# Patient Record
Sex: Female | Born: 1955 | Race: White | Hispanic: No | Marital: Single | Smoking: Current some day smoker
Health system: Southern US, Community
[De-identification: ages and names within clinical notes are randomized; demographics above are authoritative.]

## PROBLEM LIST (undated history)

## (undated) DIAGNOSIS — K219 Gastro-esophageal reflux disease without esophagitis: Secondary | ICD-10-CM

## (undated) DIAGNOSIS — J189 Pneumonia, unspecified organism: Secondary | ICD-10-CM

## (undated) DIAGNOSIS — K8050 Calculus of bile duct without cholangitis or cholecystitis without obstruction: Secondary | ICD-10-CM

## (undated) DIAGNOSIS — I739 Peripheral vascular disease, unspecified: Secondary | ICD-10-CM

## (undated) DIAGNOSIS — I10 Essential (primary) hypertension: Secondary | ICD-10-CM

## (undated) HISTORY — PX: OTHER SURGICAL HISTORY: SHX169

## (undated) HISTORY — PX: DILATION AND CURETTAGE OF UTERUS: SHX78

## (undated) HISTORY — PX: TONSILLECTOMY: SUR1361

---

## 2013-06-21 ENCOUNTER — Other Ambulatory Visit: Payer: Self-pay | Admitting: Family Medicine

## 2013-06-21 DIAGNOSIS — R1013 Epigastric pain: Secondary | ICD-10-CM

## 2013-06-28 ENCOUNTER — Other Ambulatory Visit: Payer: Self-pay

## 2013-11-03 ENCOUNTER — Ambulatory Visit
Admission: RE | Admit: 2013-11-03 | Discharge: 2013-11-03 | Disposition: A | Discharge status: Home / Self Care | Payer: Commercial Managed Care - PPO | Source: Ambulatory Visit | Attending: Family Medicine | Admitting: Family Medicine

## 2013-11-03 ENCOUNTER — Other Ambulatory Visit: Payer: Self-pay | Admitting: Family Medicine

## 2013-11-03 DIAGNOSIS — M7989 Other specified soft tissue disorders: Secondary | ICD-10-CM

## 2014-03-29 ENCOUNTER — Other Ambulatory Visit: Payer: Self-pay | Admitting: Gastroenterology

## 2014-03-29 DIAGNOSIS — R7402 Elevation of levels of lactic acid dehydrogenase (LDH): Secondary | ICD-10-CM

## 2014-03-29 DIAGNOSIS — R740 Nonspecific elevation of levels of transaminase and lactic acid dehydrogenase [LDH]: Principal | ICD-10-CM

## 2014-04-04 ENCOUNTER — Ambulatory Visit
Admission: RE | Admit: 2014-04-04 | Discharge: 2014-04-04 | Disposition: A | Discharge status: Home / Self Care | Payer: Commercial Managed Care - PPO | Source: Ambulatory Visit | Attending: Gastroenterology | Admitting: Gastroenterology

## 2014-04-04 DIAGNOSIS — R740 Nonspecific elevation of levels of transaminase and lactic acid dehydrogenase [LDH]: Principal | ICD-10-CM

## 2014-04-04 DIAGNOSIS — R7402 Elevation of levels of lactic acid dehydrogenase (LDH): Secondary | ICD-10-CM

## 2014-05-24 ENCOUNTER — Other Ambulatory Visit (HOSPITAL_COMMUNITY)
Admission: RE | Admit: 2014-05-24 | Discharge: 2014-05-24 | Disposition: A | Discharge status: Home / Self Care | Payer: Commercial Managed Care - PPO | Source: Ambulatory Visit | Attending: Family Medicine | Admitting: Family Medicine

## 2014-05-24 ENCOUNTER — Other Ambulatory Visit: Payer: Self-pay | Admitting: Family Medicine

## 2014-05-24 DIAGNOSIS — Z01419 Encounter for gynecological examination (general) (routine) without abnormal findings: Secondary | ICD-10-CM | POA: Diagnosis not present

## 2014-05-25 LAB — CYTOLOGY - PAP

## 2014-07-27 ENCOUNTER — Other Ambulatory Visit: Payer: Self-pay | Admitting: Family Medicine

## 2014-07-27 DIAGNOSIS — R1013 Epigastric pain: Secondary | ICD-10-CM

## 2014-07-28 ENCOUNTER — Ambulatory Visit
Admission: RE | Admit: 2014-07-28 | Discharge: 2014-07-28 | Disposition: A | Discharge status: Home / Self Care | Payer: Commercial Managed Care - PPO | Source: Ambulatory Visit | Attending: Family Medicine | Admitting: Family Medicine

## 2014-07-28 DIAGNOSIS — R1013 Epigastric pain: Secondary | ICD-10-CM

## 2014-07-28 MED ORDER — IOHEXOL 300 MG/ML  SOLN
100.0000 mL | Freq: Once | INTRAMUSCULAR | Status: AC | PRN
  Administered 2014-07-28: 100 mL via INTRAVENOUS

## 2014-08-03 ENCOUNTER — Other Ambulatory Visit (INDEPENDENT_AMBULATORY_CARE_PROVIDER_SITE_OTHER): Payer: Self-pay | Admitting: General Surgery

## 2014-08-03 DIAGNOSIS — Z9884 Bariatric surgery status: Secondary | ICD-10-CM

## 2016-01-03 ENCOUNTER — Other Ambulatory Visit: Payer: Self-pay | Admitting: Surgery

## 2016-01-03 DIAGNOSIS — R748 Abnormal levels of other serum enzymes: Secondary | ICD-10-CM

## 2016-01-10 ENCOUNTER — Ambulatory Visit
Admission: RE | Admit: 2016-01-10 | Discharge: 2016-01-10 | Disposition: A | Discharge status: Home / Self Care | Payer: Managed Care, Other (non HMO) | Source: Ambulatory Visit | Attending: Surgery | Admitting: Surgery

## 2016-01-10 DIAGNOSIS — R748 Abnormal levels of other serum enzymes: Secondary | ICD-10-CM

## 2016-01-10 MED ORDER — IOPAMIDOL (ISOVUE-300) INJECTION 61%
100.0000 mL | Freq: Once | INTRAVENOUS | Status: AC | PRN
  Administered 2016-01-10: 100 mL via INTRAVENOUS

## 2016-01-27 ENCOUNTER — Ambulatory Visit: Payer: Self-pay | Admitting: Surgery

## 2016-07-08 NOTE — Progress Notes (Signed)
Surgery on 07/15/16.  Need  Orders in EPIC .  Thank You.

## 2016-07-10 ENCOUNTER — Ambulatory Visit: Payer: Self-pay | Admitting: Surgery

## 2016-07-11 ENCOUNTER — Encounter (HOSPITAL_COMMUNITY)
Admission: RE | Admit: 2016-07-11 | Discharge: 2016-07-11 | Disposition: A | Discharge status: Home / Self Care | Payer: Managed Care, Other (non HMO) | Source: Ambulatory Visit | Attending: Surgery | Admitting: Surgery

## 2016-07-11 ENCOUNTER — Encounter (HOSPITAL_COMMUNITY): Payer: Self-pay

## 2016-07-11 DIAGNOSIS — Z01818 Encounter for other preprocedural examination: Secondary | ICD-10-CM | POA: Diagnosis present

## 2016-07-11 DIAGNOSIS — I10 Essential (primary) hypertension: Secondary | ICD-10-CM | POA: Insufficient documentation

## 2016-07-11 HISTORY — DX: Essential (primary) hypertension: I10

## 2016-07-11 HISTORY — DX: Gastro-esophageal reflux disease without esophagitis: K21.9

## 2016-07-11 HISTORY — DX: Pneumonia, unspecified organism: J18.9

## 2016-07-11 HISTORY — DX: Peripheral vascular disease, unspecified: I73.9

## 2016-07-11 LAB — CBC
HEMATOCRIT: 34.6 % — AB (ref 36.0–46.0)
Hemoglobin: 11.3 g/dL — ABNORMAL LOW (ref 12.0–15.0)
MCH: 30.1 pg (ref 26.0–34.0)
MCHC: 32.7 g/dL (ref 30.0–36.0)
MCV: 92.3 fL (ref 78.0–100.0)
Platelets: 211 10*3/uL (ref 150–400)
RBC: 3.75 MIL/uL — AB (ref 3.87–5.11)
RDW: 13.9 % (ref 11.5–15.5)
WBC: 4.8 10*3/uL (ref 4.0–10.5)

## 2016-07-11 LAB — BASIC METABOLIC PANEL
ANION GAP: 8 (ref 5–15)
BUN: 22 mg/dL — ABNORMAL HIGH (ref 6–20)
CO2: 26 mmol/L (ref 22–32)
Calcium: 8.9 mg/dL (ref 8.9–10.3)
Chloride: 104 mmol/L (ref 101–111)
Creatinine, Ser: 0.92 mg/dL (ref 0.44–1.00)
GLUCOSE: 96 mg/dL (ref 65–99)
POTASSIUM: 3.5 mmol/L (ref 3.5–5.1)
Sodium: 138 mmol/L (ref 135–145)

## 2016-07-11 NOTE — Patient Instructions (Addendum)
Stacy HendersonLori Lane  07/11/2016   Your procedure is scheduled on: 07/15/16  [facility]  [facility]  [facility] to 3rd floor to  Short Stay Center at 9:15 AM.  Call this number if you have problems the morning of surgery [phone]   Remember: ONLY 1 PERSON MAY GO WITH YOU TO SHORT STAY TO GET  READY MORNING OF YOUR SURGERY.  Do not eat food or drink liquids :After Midnight.     Take these medicines the morning of surgery with A SIP OF WATER: Cymbalta                               You may not have any metal on your body including hair pins and              piercings  Do not wear jewelry, make-up, lotions, powders or perfumes, deodorant             Do not wear nail polish.  Do not shave  48 hours prior to surgery.              Men may shave face and neck.   Do not bring valuables to the hospital. [facility] IS NOT             RESPONSIBLE   FOR VALUABLES.  Contacts, dentures or bridgework may not be worn into surgery.  Leave suitcase in the car. After surgery it may be brought to your room.                Please read over the following fact sheets you were given: _____________________________________________________________________             [facility] - Preparing for Surgery Before surgery, you can play an important role.  Because skin is not sterile, your skin needs to be as free of germs as possible.  You can reduce the number of germs on your skin by washing with CHG (chlorahexidine gluconate) soap before surgery.  CHG is an antiseptic cleaner which kills germs and bonds with the skin to continue killing germs even after washing. Please DO NOT use if you have an allergy to CHG or antibacterial soaps.  If your skin becomes reddened/irritated stop using the CHG and inform your nurse when you arrive at Short Stay. Do not shave (including legs and underarms) for at least 48 hours prior to the first CHG shower.  You may shave your  face/neck. Please follow these instructions carefully:  1.  Shower with CHG Soap the night before surgery and the  morning of Surgery.  2.  If you choose to wash your hair, wash your hair first as usual with your  normal  shampoo.  3.  After you shampoo, rinse your hair and body thoroughly to remove the  shampoo.                           4.  Use CHG as you would any other liquid soap.  You can apply chg directly  to the skin and wash                       Gently with a scrungie or clean washcloth.  5.  Apply the CHG Soap to your body ONLY  FROM THE NECK DOWN.   Do not use on face/ open                           Wound or open sores. Avoid contact with eyes, ears mouth and genitals (private parts).                       Wash face,  Genitals (private parts) with your normal soap.             6.  Wash thoroughly, paying special attention to the area where your surgery  will be performed.  7.  Thoroughly rinse your body with warm water from the neck down.  8.  DO NOT shower/wash with your normal soap after using and rinsing off  the CHG Soap.                9.  Pat yourself dry with a clean towel.            10.  Wear clean pajamas.            11.  Place clean sheets on your bed the night of your first shower and do not  sleep with pets. Day of Surgery : Do not apply any lotions/deodorants the morning of surgery.  Please wear clean clothes to the hospital/surgery center.  FAILURE TO FOLLOW THESE INSTRUCTIONS MAY RESULT IN THE CANCELLATION OF YOUR SURGERY PATIENT SIGNATURE_________________________________  NURSE SIGNATURE__________________________________  ________________________________________________________________________

## 2016-07-14 ENCOUNTER — Ambulatory Visit: Payer: Self-pay | Admitting: Surgery

## 2016-07-14 NOTE — H&P (Signed)
Stacy HendersonLori Lane 01/24/2016 10:22 AM Location: [facility] Patient #: [id number] DOB: 06-01-1956 Divorced / Language: [place] / Race: White Female   History of Present Illness Stacy Lane(Stacy Lane B. Daphine DeutscherMartin MD; 01/24/2016 10:48 AM) The patient is a 60 year old female who presents for evaluation of gall stones. I reviewed her recent CT scan with her. She has gallstones and elevated alkaline phosphatase up to 600 on some studies. We have talked about doing a laparoscopic cholecystectomy before but she had decided against that. She says she does feel tired and gets bloated at times and just has been feeling worse the last few months. She [place] open gastric [place] about [place].  I recommended laparoscopic cholecystectomy with intraoperative cholangiogram and with probably a liver biopsy. After much discussion we decided to go and proceed with that. Procedure was explained to her in some detail. We'll proceed.   Allergies Fay Records(Ashley Beck, [place]; 01/24/2016 10:22 AM) Codeine Phosphate *ANALGESICS - OPIOID*  Medication History Fay Records(Ashley Beck, CMA; 01/24/2016 10:22 AM) Vyvanse (60MG  Capsule, Oral) Active. Lisinopril-Hydrochlorothiazide (20-25MG  Tablet, Oral) Active. Medications Reconciled  Vitals Fay Records(Ashley Beck CMA; 01/24/2016 10:23 AM) 01/24/2016 10:23 AM Weight: 160 lb Height: 66in Body Surface Area: 1.82 m Body Mass Index: 25.82 kg/m  Temp.: 97.69F  Pulse: 115 (Regular)  BP: 122/86 (Sitting, Left Arm, Standard)       Physical Exam (Masato Pettie B. Daphine DeutscherMartin MD; 01/24/2016 10:49 AM) The physical exam findings are as follows: Note:HEENT normal Neck supple Chest clear Heart SR without murmurs Abdomen nontender; prior open bypass Ext FROM.Marland Kitchen.wearing support hose for more recent fluid retention.    Assessment & Plan Stacy Lane(Shirley Decamp B. Daphine DeutscherMartin MD; 01/24/2016 10:51 AM) SYMPTOMATIC CHOLELITHIASIS (K80.20) Impression: Will proceed with scheduling lap chole with IOC and liver biopsy.  Procedures explained to her in some detail. This had been done before as we were going to schedule.

## 2016-07-15 ENCOUNTER — Encounter (HOSPITAL_COMMUNITY)
Admission: RE | Disposition: A | Discharge status: Home / Self Care | Payer: Self-pay | Source: Ambulatory Visit | Attending: Surgery

## 2016-07-15 ENCOUNTER — Ambulatory Visit (HOSPITAL_COMMUNITY): Payer: Managed Care, Other (non HMO)

## 2016-07-15 ENCOUNTER — Ambulatory Visit (HOSPITAL_COMMUNITY): Payer: Managed Care, Other (non HMO) | Admitting: Certified Registered"

## 2016-07-15 ENCOUNTER — Ambulatory Visit (HOSPITAL_COMMUNITY)
Admission: RE | Admit: 2016-07-15 | Discharge: 2016-07-15 | Disposition: A | Discharge status: Home / Self Care | Payer: Managed Care, Other (non HMO) | Source: Ambulatory Visit | Attending: Surgery | Admitting: Surgery

## 2016-07-15 ENCOUNTER — Encounter (HOSPITAL_COMMUNITY): Payer: Self-pay | Admitting: *Deleted

## 2016-07-15 DIAGNOSIS — I739 Peripheral vascular disease, unspecified: Secondary | ICD-10-CM | POA: Insufficient documentation

## 2016-07-15 DIAGNOSIS — F17210 Nicotine dependence, cigarettes, uncomplicated: Secondary | ICD-10-CM | POA: Insufficient documentation

## 2016-07-15 DIAGNOSIS — K219 Gastro-esophageal reflux disease without esophagitis: Secondary | ICD-10-CM | POA: Diagnosis not present

## 2016-07-15 DIAGNOSIS — Z9884 Bariatric surgery status: Secondary | ICD-10-CM | POA: Diagnosis not present

## 2016-07-15 DIAGNOSIS — Z419 Encounter for procedure for purposes other than remedying health state, unspecified: Secondary | ICD-10-CM

## 2016-07-15 DIAGNOSIS — Z885 Allergy status to narcotic agent status: Secondary | ICD-10-CM | POA: Diagnosis not present

## 2016-07-15 DIAGNOSIS — K8010 Calculus of gallbladder with chronic cholecystitis without obstruction: Secondary | ICD-10-CM | POA: Insufficient documentation

## 2016-07-15 DIAGNOSIS — I10 Essential (primary) hypertension: Secondary | ICD-10-CM | POA: Diagnosis not present

## 2016-07-15 DIAGNOSIS — K811 Chronic cholecystitis: Secondary | ICD-10-CM | POA: Diagnosis present

## 2016-07-15 HISTORY — PX: CHOLECYSTECTOMY: SHX55

## 2016-07-15 LAB — COMPREHENSIVE METABOLIC PANEL
ALT: 45 U/L (ref 14–54)
ANION GAP: 8 (ref 5–15)
AST: 74 U/L — ABNORMAL HIGH (ref 15–41)
Albumin: 3.2 g/dL — ABNORMAL LOW (ref 3.5–5.0)
Alkaline Phosphatase: 570 U/L — ABNORMAL HIGH (ref 38–126)
BUN: 19 mg/dL (ref 6–20)
CHLORIDE: 105 mmol/L (ref 101–111)
CO2: 26 mmol/L (ref 22–32)
CREATININE: 1 mg/dL (ref 0.44–1.00)
Calcium: 8.7 mg/dL — ABNORMAL LOW (ref 8.9–10.3)
Glucose, Bld: 104 mg/dL — ABNORMAL HIGH (ref 65–99)
POTASSIUM: 4.2 mmol/L (ref 3.5–5.1)
SODIUM: 139 mmol/L (ref 135–145)
Total Bilirubin: 0.9 mg/dL (ref 0.3–1.2)
Total Protein: 6.7 g/dL (ref 6.5–8.1)

## 2016-07-15 SURGERY — LAPAROSCOPIC CHOLECYSTECTOMY WITH INTRAOPERATIVE CHOLANGIOGRAM
Anesthesia: General

## 2016-07-15 MED ORDER — IOPAMIDOL (ISOVUE-300) INJECTION 61%
INTRAVENOUS | Status: DC | PRN
  Administered 2016-07-15: 14 mL

## 2016-07-15 MED ORDER — CEFAZOLIN SODIUM-DEXTROSE 2-4 GM/100ML-% IV SOLN
2.0000 g | INTRAVENOUS | Status: AC
  Administered 2016-07-15: 2 g via INTRAVENOUS
  Filled 2016-07-15: qty 100

## 2016-07-15 MED ORDER — CEFAZOLIN SODIUM-DEXTROSE 2-4 GM/100ML-% IV SOLN: 2.0000 g | INTRAVENOUS | Status: DC

## 2016-07-15 MED ORDER — ACETAMINOPHEN 500 MG PO TABS: 1000.0000 mg | ORAL_TABLET | ORAL | Status: DC

## 2016-07-15 MED ORDER — BUPIVACAINE HCL (PF) 0.5 % IJ SOLN
INTRAMUSCULAR | Status: AC
  Filled 2016-07-15: qty 30

## 2016-07-15 MED ORDER — CEFAZOLIN SODIUM-DEXTROSE 2-4 GM/100ML-% IV SOLN
INTRAVENOUS | Status: AC
  Filled 2016-07-15: qty 100

## 2016-07-15 MED ORDER — HEPARIN SODIUM (PORCINE) 5000 UNIT/ML IJ SOLN: 5000.0000 [IU] | Freq: Once | INTRAMUSCULAR | Status: DC

## 2016-07-15 MED ORDER — FENTANYL CITRATE (PF) 250 MCG/5ML IJ SOLN
INTRAMUSCULAR | Status: AC
  Filled 2016-07-15: qty 5

## 2016-07-15 MED ORDER — FENTANYL CITRATE (PF) 100 MCG/2ML IJ SOLN
INTRAMUSCULAR | Status: AC
  Filled 2016-07-15: qty 2

## 2016-07-15 MED ORDER — FENTANYL CITRATE (PF) 100 MCG/2ML IJ SOLN
INTRAMUSCULAR | Status: DC | PRN
  Administered 2016-07-15 (×2): 50 ug via INTRAVENOUS
  Administered 2016-07-15: 100 ug via INTRAVENOUS
  Administered 2016-07-15: 50 ug via INTRAVENOUS

## 2016-07-15 MED ORDER — LIDOCAINE HCL (CARDIAC) 20 MG/ML IV SOLN
INTRAVENOUS | Status: DC | PRN
  Administered 2016-07-15: 50 mg via INTRAVENOUS

## 2016-07-15 MED ORDER — LIDOCAINE HCL (CARDIAC) 20 MG/ML IV SOLN
INTRAVENOUS | Status: DC | PRN
  Administered 2016-07-15: 50 mg via INTRATRACHEAL

## 2016-07-15 MED ORDER — SUGAMMADEX SODIUM 200 MG/2ML IV SOLN
INTRAVENOUS | Status: DC | PRN
  Administered 2016-07-15: 160 mg via INTRAVENOUS

## 2016-07-15 MED ORDER — FENTANYL CITRATE (PF) 100 MCG/2ML IJ SOLN
25.0000 ug | INTRAMUSCULAR | Status: DC | PRN
  Administered 2016-07-15: 50 ug via INTRAVENOUS

## 2016-07-15 MED ORDER — ACETAMINOPHEN 500 MG PO TABS
1000.0000 mg | ORAL_TABLET | ORAL | Status: AC
  Administered 2016-07-15: 1000 mg via ORAL
  Filled 2016-07-15: qty 2

## 2016-07-15 MED ORDER — MIDAZOLAM HCL 2 MG/2ML IJ SOLN
INTRAMUSCULAR | Status: DC | PRN
  Administered 2016-07-15: 2 mg via INTRAVENOUS

## 2016-07-15 MED ORDER — CELECOXIB 200 MG PO CAPS: 400.0000 mg | ORAL_CAPSULE | ORAL | Status: DC

## 2016-07-15 MED ORDER — PROPOFOL 10 MG/ML IV BOLUS
INTRAVENOUS | Status: DC | PRN
  Administered 2016-07-15: 160 mg via INTRAVENOUS

## 2016-07-15 MED ORDER — 0.9 % SODIUM CHLORIDE (POUR BTL) OPTIME
TOPICAL | Status: DC | PRN
  Administered 2016-07-15: 1000 mL

## 2016-07-15 MED ORDER — DEXAMETHASONE SODIUM PHOSPHATE 10 MG/ML IJ SOLN
INTRAMUSCULAR | Status: DC | PRN
  Administered 2016-07-15: 10 mg via INTRAVENOUS

## 2016-07-15 MED ORDER — PROMETHAZINE HCL 25 MG/ML IJ SOLN: 6.2500 mg | INTRAMUSCULAR | Status: DC | PRN

## 2016-07-15 MED ORDER — IOPAMIDOL (ISOVUE-300) INJECTION 61%
INTRAVENOUS | Status: AC
  Filled 2016-07-15: qty 50

## 2016-07-15 MED ORDER — GABAPENTIN 300 MG PO CAPS: 300.0000 mg | ORAL_CAPSULE | ORAL | Status: DC

## 2016-07-15 MED ORDER — HYDROCODONE-ACETAMINOPHEN 5-325 MG PO TABS: 1.0000 | ORAL_TABLET | ORAL | 0 refills | Status: DC | PRN

## 2016-07-15 MED ORDER — MIDAZOLAM HCL 2 MG/2ML IJ SOLN
INTRAMUSCULAR | Status: AC
  Filled 2016-07-15: qty 2

## 2016-07-15 MED ORDER — ROCURONIUM BROMIDE 50 MG/5ML IV SOSY
PREFILLED_SYRINGE | INTRAVENOUS | Status: AC
  Filled 2016-07-15: qty 5

## 2016-07-15 MED ORDER — PROPOFOL 10 MG/ML IV BOLUS
INTRAVENOUS | Status: AC
Start: 2016-07-15 — End: 2016-07-15
  Filled 2016-07-15: qty 20

## 2016-07-15 MED ORDER — HEPARIN SODIUM (PORCINE) 5000 UNIT/ML IJ SOLN
5000.0000 [IU] | Freq: Once | INTRAMUSCULAR | Status: AC
  Administered 2016-07-15: 5000 [IU] via SUBCUTANEOUS
  Filled 2016-07-15: qty 1

## 2016-07-15 MED ORDER — ROCURONIUM BROMIDE 100 MG/10ML IV SOLN
INTRAVENOUS | Status: DC | PRN
  Administered 2016-07-15: 50 mg via INTRAVENOUS
  Administered 2016-07-15: 10 mg via INTRAVENOUS

## 2016-07-15 MED ORDER — LACTATED RINGERS IV SOLN
INTRAVENOUS | Status: DC
  Administered 2016-07-15 (×2): via INTRAVENOUS

## 2016-07-15 MED ORDER — CELECOXIB 200 MG PO CAPS
400.0000 mg | ORAL_CAPSULE | ORAL | Status: AC
  Administered 2016-07-15: 400 mg via ORAL
  Filled 2016-07-15: qty 2

## 2016-07-15 MED ORDER — GABAPENTIN 300 MG PO CAPS
300.0000 mg | ORAL_CAPSULE | ORAL | Status: AC
  Administered 2016-07-15: 300 mg via ORAL
  Filled 2016-07-15: qty 1

## 2016-07-15 MED ORDER — BUPIVACAINE HCL (PF) 0.5 % IJ SOLN
INTRAMUSCULAR | Status: DC | PRN
  Administered 2016-07-15: 30 mL

## 2016-07-15 MED ORDER — LACTATED RINGERS IR SOLN
Status: DC | PRN
  Administered 2016-07-15: 1000 mL

## 2016-07-15 SURGICAL SUPPLY — 42 items
APPLICATOR ARISTA FLEXITIP XL (MISCELLANEOUS) ×3 IMPLANT
APPLICATOR COTTON TIP 6IN STRL (MISCELLANEOUS) ×6 IMPLANT
APPLIER CLIP ROT 10 11.4 M/L (STAPLE) ×3
BENZOIN TINCTURE PRP APPL 2/3 (GAUZE/BANDAGES/DRESSINGS) IMPLANT
CABLE HIGH FREQUENCY MONO STRZ (ELECTRODE) ×3 IMPLANT
CATH REDDICK CHOLANGI 4FR 50CM (CATHETERS) ×3 IMPLANT
CLIP APPLIE ROT 10 11.4 M/L (STAPLE) ×1 IMPLANT
CLOSURE WOUND 1/2 X4 (GAUZE/BANDAGES/DRESSINGS)
COVER MAYO STAND STRL (DRAPES) ×3 IMPLANT
COVER SURGICAL LIGHT HANDLE (MISCELLANEOUS) ×3 IMPLANT
DECANTER SPIKE VIAL GLASS SM (MISCELLANEOUS) ×3 IMPLANT
DERMABOND ADVANCED (GAUZE/BANDAGES/DRESSINGS) ×2
DERMABOND ADVANCED .7 DNX12 (GAUZE/BANDAGES/DRESSINGS) ×1 IMPLANT
DRAPE C-ARM 42X120 X-RAY (DRAPES) ×3 IMPLANT
ELECT PENCIL ROCKER SW 15FT (MISCELLANEOUS) IMPLANT
ELECT REM PT RETURN 9FT ADLT (ELECTROSURGICAL) ×3
ELECTRODE REM PT RTRN 9FT ADLT (ELECTROSURGICAL) ×1 IMPLANT
ENDOLOOP SUT PDS II  0 18 (SUTURE) ×2
ENDOLOOP SUT PDS II 0 18 (SUTURE) ×1 IMPLANT
GLOVE BIOGEL M 8.0 STRL (GLOVE) ×3 IMPLANT
GOWN STRL REUS W/TWL XL LVL3 (GOWN DISPOSABLE) ×9 IMPLANT
HEMOSTAT ARISTA ABSORB 3G PWDR (MISCELLANEOUS) ×3 IMPLANT
HEMOSTAT SURGICEL 4X8 (HEMOSTASIS) IMPLANT
IRRIG SUCT STRYKERFLOW 2 WTIP (MISCELLANEOUS) ×3
IRRIGATION SUCT STRKRFLW 2 WTP (MISCELLANEOUS) ×1 IMPLANT
IV CATH 14GX2 1/4 (CATHETERS) ×3 IMPLANT
KIT BASIN OR (CUSTOM PROCEDURE TRAY) ×3 IMPLANT
L-HOOK LAP DISP 36CM (ELECTROSURGICAL)
LHOOK LAP DISP 36CM (ELECTROSURGICAL) IMPLANT
POUCH RETRIEVAL ECOSAC 10 (ENDOMECHANICALS) IMPLANT
POUCH RETRIEVAL ECOSAC 10MM (ENDOMECHANICALS)
SCISSORS LAP 5X45 EPIX DISP (ENDOMECHANICALS) ×3 IMPLANT
SLEEVE XCEL OPT CAN 5 100 (ENDOMECHANICALS) ×3 IMPLANT
STRIP CLOSURE SKIN 1/2X4 (GAUZE/BANDAGES/DRESSINGS) IMPLANT
SUT VIC AB 4-0 SH 18 (SUTURE) ×3 IMPLANT
SYR 20CC LL (SYRINGE) ×3 IMPLANT
TOWEL OR 17X26 10 PK STRL BLUE (TOWEL DISPOSABLE) ×3 IMPLANT
TRAY LAPAROSCOPIC (CUSTOM PROCEDURE TRAY) ×3 IMPLANT
TROCAR BLADELESS OPT 5 100 (ENDOMECHANICALS) ×3 IMPLANT
TROCAR XCEL BLUNT TIP 100MML (ENDOMECHANICALS) IMPLANT
TROCAR XCEL NON-BLD 11X100MML (ENDOMECHANICALS) ×3 IMPLANT
TUBING INSUF HEATED (TUBING) ×3 IMPLANT

## 2016-07-15 NOTE — Anesthesia Preprocedure Evaluation (Signed)
Anesthesia Evaluation  Patient identified by MRN, date of birth, ID band Patient awake    Reviewed: Allergy & Precautions, NPO status , Patient's Chart, lab work & pertinent test results  Airway Mallampati: II  TM Distance: >3 FB Neck ROM: Full    Dental no notable dental hx.    Pulmonary pneumonia, Current Smoker,    Pulmonary exam normal breath sounds clear to auscultation       Cardiovascular hypertension, + Peripheral Vascular Disease  Normal cardiovascular exam Rhythm:Regular Rate:Normal     Neuro/Psych negative neurological ROS  negative psych ROS   GI/Hepatic Neg liver ROS, GERD  ,  Endo/Other  negative endocrine ROS  Renal/GU negative Renal ROS  negative genitourinary   Musculoskeletal negative musculoskeletal ROS (+)   Abdominal   Peds negative pediatric ROS (+)  Hematology negative hematology ROS (+)   Anesthesia Other Findings   Reproductive/Obstetrics negative OB ROS                             Anesthesia Physical Anesthesia Plan  ASA: II  Anesthesia Plan: General   Post-op Pain Management:    Induction: Intravenous  Airway Management Planned: Oral ETT  Additional Equipment:   Intra-op Plan:   Post-operative Plan: Extubation in OR  Informed Consent: I have reviewed the patients History and Physical, chart, labs and discussed the procedure including the risks, benefits and alternatives for the proposed anesthesia with the patient or authorized representative who has indicated his/her understanding and acceptance.   Dental advisory given  Plan Discussed with: CRNA  Anesthesia Plan Comments:         Anesthesia Quick Evaluation

## 2016-07-15 NOTE — Interval H&P Note (Signed)
History and Physical Interval Note:  07/15/2016 1:00 PM  Stacy HendersonLori Pietro  has presented today for surgery, with the diagnosis of elevated alkaline phosphatase and gallstones  The various methods of treatment have been discussed with the patient and family. After consideration of risks, benefits and other options for treatment, the patient has consented to  Procedure(s): LAPAROSCOPIC CHOLECYSTECTOMY WITH INTRAOPERATIVE CHOLANGIOGRAM (N/A) LIVER BIOPSY (N/A) as a surgical intervention .  The patient's history has been reviewed, patient examined, no change in status, stable for surgery.  I have reviewed the patient's chart and labs.  Questions were answered to the patient's satisfaction.     Keigen Caddell B

## 2016-07-15 NOTE — Anesthesia Procedure Notes (Signed)
Procedure Name: Intubation Date/Time: 07/15/2016 1:28 PM Performed by: Donna BernardRIMBLE, Damichael Hofman H Pre-anesthesia Checklist: Patient identified, Emergency Drugs available, Suction available, Patient being monitored and Timeout performed Patient Re-evaluated:Patient Re-evaluated prior to inductionOxygen Delivery Method: Circle system utilized Preoxygenation: Pre-oxygenation with 100% oxygen Intubation Type: IV induction Ventilation: Mask ventilation without difficulty Laryngoscope Size: Miller and 2 Grade View: Grade I Tube type: Oral Tube size: 7.5 mm Number of attempts: 1 Airway Equipment and Method: Stylet Placement Confirmation: positive ETCO2,  ETT inserted through vocal cords under direct vision,  CO2 detector and breath sounds checked- equal and bilateral Secured at: 22 cm Tube secured with: Tape Dental Injury: Teeth and Oropharynx as per pre-operative assessment

## 2016-07-15 NOTE — Op Note (Signed)
Callista Hoh   07/15/2016  2:57 PM  Procedure: Laparoscopic Cholecystectomy with intraoperative cholangiogram  Surgeon: Catalina Antigua B. 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  The patient was prepped with PCMX and draped sterilely. A time out was performed.  Access to the abdomen was achieved with a 5 mm trocar through the right upper quadrant.  Port placement included three 5 mm trocars and a 11 mm in the upper midline.    The gallbladder was visualized and the fundus was grasped and the gallbladder was elevated. Traction on the infundibulum allowed for successful demonstration of the critical view. Inflammatory changes were chronic.  The cystic duct was identified and clipped up on the gallbladder and an incision was made in the cystic duct and the Reddick catheter was inserted after milking the cystic duct of any debris. A dynamic cholangiogram was performed which demonstrated a huge CBD with distal filling defects consistent with stones (that would explain her alk phos >700).    The cystic duct was then triple clipped and divided, the cystic artery was double clipped and divided and then the gallbladder was removed from the gallbladder bed. Removal of the gallbladder from the gallbladder bed was straightforward.  The gallbladder was then placed in a bag and brought out through one of the trocar sites. The gallbladder bed was inspected and no bleeding or bile leaks were seen.  Arista was place in the galbladder bed.  The port sites were injected with marcaine and closed with 4-0 monocryl and Dermabond on the skin.  Sponge and needle count were correct.    The patient was taken to the recovery room in satisfactory condition.

## 2016-07-15 NOTE — Transfer of Care (Signed)
Immediate Anesthesia Transfer of Care Note  Patient: Stacy HendersonLori Jayne  Procedure(s) Performed: Procedure(s): LAPAROSCOPIC CHOLECYSTECTOMY WITH INTRAOPERATIVE CHOLANGIOGRAM (N/A)  Patient Location: PACU  Anesthesia Type:General  Level of Consciousness:  sedated, patient cooperative and responds to stimulation  Airway & Oxygen Therapy:Patient Spontanous Breathing and Patient connected to face mask oxgen  Post-op Assessment:  Report given to PACU RN and Post -op Vital signs reviewed and stable  Post vital signs:  Reviewed and stable  Last Vitals:  Vitals:   07/15/16 1004  BP: 124/73  Pulse: 82  Resp: 18  Temp: 36.9 C    Complications: No apparent anesthesia complications

## 2016-07-15 NOTE — Discharge Instructions (Signed)
General Anesthesia, Adult, Care After Refer to this sheet in the next few weeks. These instructions provide you with information on caring for yourself after your procedure. Your health care provider may also give you more specific instructions. Your treatment has been planned according to current medical practices, but problems sometimes occur. Call your health care provider if you have any problems or questions after your procedure. WHAT TO EXPECT AFTER THE PROCEDURE After the procedure, it is typical to experience:  Sleepiness.  Nausea and vomiting. HOME CARE INSTRUCTIONS  For the first 24 hours after general anesthesia:  Have a responsible person with you.  Do not drive a car. If you are alone, do not take public transportation.  Do not drink alcohol.  Do not take medicine that has not been prescribed by your health care provider.  Do not sign important papers or make important decisions.  You may resume a normal diet and activities as directed by your health care provider.  Change bandages (dressings) as directed.  If you have questions or problems that seem related to general anesthesia, call the hospital and ask for the anesthetist or anesthesiologist on call. SEEK MEDICAL CARE IF:  You have nausea and vomiting that continue the day after anesthesia.  You develop a rash. SEEK IMMEDIATE MEDICAL CARE IF:   You have difficulty breathing.  You have chest pain.  You have any allergic problems.   This information is not intended to replace advice given to you by your health care provider. Make sure you discuss any questions you have with your health care provider.   Document Released: 12/08/2000 Document Revised: 09/22/2014 Document Reviewed: 12/31/2011 Elsevier Interactive Patient Education 2016 Elsevier Inc.  Laparoscopic Cholecystectomy, Care After These instructions give you information about caring for yourself after your procedure. Your doctor may also give you  more specific instructions. Call your doctor if you have any problems or questions after your procedure. HOME CARE Incision Care  Follow instructions from your doctor about how to take care of your cuts from surgery (incisions). Make sure you:  Wash your hands with soap and water before you change your bandage (dressing). If you cannot use soap and water, use hand sanitizer.  Change your bandage as told by your doctor.  Leave stitches (sutures), skin glue, or skin tape (adhesive) strips in place. They may need to stay in place for 2 weeks or longer. If tape strips get loose and curl up, you may trim the loose edges. Do not remove tape strips completely unless your doctor says it is okay.  Do not take baths, swim, or use a hot tub until your doctor says it is okay. Ask your doctor if you can take showers. You may only be allowed to take sponge baths. General Instructions  Take over-the-counter and prescription medicines only as told by your doctor.  Do not drive or use heavy machinery while taking prescription pain medicine.  Return to your normal diet as told by your doctor.  Do not lift anything that is heavier than 10 lb (4.5 kg).  Do not play contact sports for 1 week or until your doctor says it is okay. GET HELP IF:  You have redness, swelling, or pain at the site of your surgical cuts.  You have fluid, blood, or pus coming from your cuts.  You notice a bad smell coming from your cut area.  Your surgical cuts break open.  You have a fever. GET HELP RIGHT AWAY IF:   You  have a rash.  You have trouble breathing.  You have chest pain.  You have pain in your shoulders (shoulder strap areas) that is getting worse.  You pass out (faint) or feel dizzy while you are standing.  You have very bad pain in your belly (abdomen).  You feel sick to your stomach (nauseous) or throw up (vomit) for more than 1 day.   This information is not intended to replace advice given to  you by your health care provider. Make sure you discuss any questions you have with your health care provider.   Document Released: 06/10/2008 Document Revised: 05/23/2015 Document Reviewed: 04/13/2013 Elsevier Interactive Patient Education [employer]2016 Elsevier Inc.

## 2016-07-16 ENCOUNTER — Encounter (HOSPITAL_COMMUNITY): Payer: Self-pay | Admitting: Surgery

## 2016-07-17 NOTE — Anesthesia Postprocedure Evaluation (Signed)
Anesthesia Post Note  Patient: Stacy HendersonLori Lane  Procedure(s) Performed: Procedure(s) (LRB): LAPAROSCOPIC CHOLECYSTECTOMY WITH INTRAOPERATIVE CHOLANGIOGRAM (N/A)  Patient location during evaluation: PACU Anesthesia Type: General Level of consciousness: awake and alert, oriented and patient cooperative Pain management: pain level controlled Vital Signs Assessment: post-procedure vital signs reviewed and stable Respiratory status: spontaneous breathing, nonlabored ventilation and respiratory function stable Cardiovascular status: blood pressure returned to baseline and stable Postop Assessment: no signs of nausea or vomiting Anesthetic complications: no Comments: delayed note entry: pt eval in PACU     Last Vitals:  Vitals:   07/15/16 1547 07/15/16 1640  BP: (!) 143/82 (!) 153/83  Pulse: 73 72  Resp: 17 18  Temp: 36.8 C 36.8 C    Last Pain:  Vitals:   07/16/16 1028  TempSrc:   PainSc: 3    Pain Goal: Patients Stated Pain Goal: 5 (07/15/16 1640)               Quintasia Theroux,E. Meiko Ives

## 2016-10-02 ENCOUNTER — Other Ambulatory Visit (HOSPITAL_COMMUNITY): Payer: Self-pay | Admitting: Surgery

## 2016-10-03 ENCOUNTER — Other Ambulatory Visit (HOSPITAL_COMMUNITY): Payer: Self-pay | Admitting: Surgery

## 2016-10-03 DIAGNOSIS — K8050 Calculus of bile duct without cholangitis or cholecystitis without obstruction: Secondary | ICD-10-CM

## 2016-10-13 ENCOUNTER — Other Ambulatory Visit: Payer: Self-pay | Admitting: Surgery

## 2016-10-13 DIAGNOSIS — K8050 Calculus of bile duct without cholangitis or cholecystitis without obstruction: Secondary | ICD-10-CM

## 2016-10-14 ENCOUNTER — Ambulatory Visit
Admission: RE | Admit: 2016-10-14 | Discharge: 2016-10-14 | Disposition: A | Discharge status: Home / Self Care | Payer: Managed Care, Other (non HMO) | Source: Ambulatory Visit | Attending: Surgery | Admitting: Surgery

## 2016-10-14 DIAGNOSIS — K8050 Calculus of bile duct without cholangitis or cholecystitis without obstruction: Secondary | ICD-10-CM

## 2016-10-14 MED ORDER — GADOBENATE DIMEGLUMINE 529 MG/ML IV SOLN: 16.0000 mL | Freq: Once | INTRAVENOUS | Status: DC | PRN

## 2016-10-14 NOTE — Progress Notes (Signed)
Scheduling pre op- please PLACE SURGICAL ORDERS IN EPIC

## 2016-10-16 NOTE — Patient Instructions (Signed)
Anibal HendersonLori Ingham  10/16/2016   Your procedure is scheduled on: 214/18    [facility]  [facility] 3rd floor to  Short Stay Center at    1000 AM.  Call this number if you have problems the morning of surgery [phone]   Remember: ONLY 1 PERSON MAY GO WITH YOU TO SHORT STAY TO GET  READY MORNING OF YOUR SURGERY.  Do not eat food or drink liquids :After Midnight.     Take these medicines the morning of surgery with A SIP OF WATER: none                                 You may not have any metal on your body including hair pins and              piercings  Do not wear jewelry, make-up, lotions, powders or perfumes, deodorant             Do not wear nail polish.  Do not shave  48 hours prior to surgery.        Do not bring valuables to the hospital. [facility] IS NOT             RESPONSIBLE   FOR VALUABLES.  Contacts, dentures or bridgework may not be worn into surgery.  Leave suitcase in the car. After surgery it may be brought to your room.     Patients discharged the day of surgery will not be allowed to drive home.  Name and phone number of your driver:  Special Instructions: N/A              Please read over the following fact sheets you were given: _____________________________________________________________________             [facility] - Preparing for Surgery Before surgery, you can play an important role.  Because skin is not sterile, your skin needs to be as free of germs as possible.  You can reduce the number of germs on your skin by washing with CHG (chlorahexidine gluconate) soap before surgery.  CHG is an antiseptic cleaner which kills germs and bonds with the skin to continue killing germs even after washing. Please DO NOT use if you have an allergy to CHG or antibacterial soaps.  If your skin becomes reddened/irritated stop using the CHG and inform your nurse when you arrive at Short Stay. Do not shave  (including legs and underarms) for at least 48 hours prior to the first CHG shower.  You may shave your face/neck. Please follow these instructions carefully:  1.  Shower with CHG Soap the night before surgery and the  morning of Surgery.  2.  If you choose to wash your hair, wash your hair first as usual with your  normal  shampoo.  3.  After you shampoo, rinse your hair and body thoroughly to remove the  shampoo.                           4.  Use CHG as you would any other liquid soap.  You can apply chg directly  to the skin and wash  Gently with a scrungie or clean washcloth.  5.  Apply the CHG Soap to your body ONLY FROM THE NECK DOWN.   Do not use on face/ open                           Wound or open sores. Avoid contact with eyes, ears mouth and genitals (private parts).                       Wash face,  Genitals (private parts) with your normal soap.             6.  Wash thoroughly, paying special attention to the area where your surgery  will be performed.  7.  Thoroughly rinse your body with warm water from the neck down.  8.  DO NOT shower/wash with your normal soap after using and rinsing off  the CHG Soap.                9.  Pat yourself dry with a clean towel.            10.  Wear clean pajamas.            11.  Place clean sheets on your bed the night of your first shower and do not  sleep with pets. Day of Surgery : Do not apply any lotions/deodorants the morning of surgery.  Please wear clean clothes to the hospital/surgery center.  FAILURE TO FOLLOW THESE INSTRUCTIONS MAY RESULT IN THE CANCELLATION OF YOUR SURGERY PATIENT SIGNATURE_________________________________  NURSE SIGNATURE__________________________________  ________________________________________________________________________

## 2016-10-17 ENCOUNTER — Inpatient Hospital Stay (HOSPITAL_COMMUNITY)
Admission: RE | Admit: 2016-10-17 | Discharge: 2016-10-17 | Disposition: A | Discharge status: Home / Self Care | Payer: Managed Care, Other (non HMO) | Source: Ambulatory Visit

## 2016-10-21 ENCOUNTER — Ambulatory Visit: Payer: Self-pay | Admitting: Surgery

## 2016-10-23 NOTE — Patient Instructions (Signed)
Stacy HendersonLori Lane  10/23/2016   Your procedure is scheduled on: Wednesday 10/29/2016  [facility]  [facility] to 3rd floor to  Short Stay Center at   1000 AM.  Call this number if you have problems the morning of surgery [phone]   Remember: ONLY 1 PERSON MAY GO WITH YOU TO SHORT STAY TO GET  READY MORNING OF YOUR SURGERY.    Do not eat food or drink liquids :After Midnight.     Take these medicines the morning of surgery with A SIP OF WATER: duloxetine (cymbalta if needed                                You may not have any metal on your body including hair pins and              piercings  Do not wear jewelry, make-up, lotions, powders or perfumes, deodorant             Do not wear nail polish.  Do not shave  48 hours prior to surgery.              Men may shave face and neck.   Do not bring valuables to the hospital. [facility] IS NOT             RESPONSIBLE   FOR VALUABLES.  Contacts, dentures or bridgework may not be worn into surgery.  Leave suitcase in the car. After surgery it may be brought to your room.                  Please read over the following fact sheets you were given: _____________________________________________________________________             [facility] - Preparing for [facility] Before surgery, you can play an important role.  Because skin is not sterile, your skin needs to be as free of germs as possible.  You can reduce the number of germs on your skin by washing with CHG (chlorahexidine gluconate) soap before surgery.  CHG is an antiseptic cleaner which kills germs and bonds with the skin to continue killing germs even after washing. Please DO NOT use if you have an allergy to CHG or antibacterial soaps.  If your skin becomes reddened/irritated stop using the CHG and inform your nurse when you arrive at Short Stay. Do not shave (including legs and underarms) for at least 48 hours prior to the first  CHG shower.  You may shave your face/neck. Please follow these instructions carefully:  1.  Shower with CHG Soap the night before surgery and the  morning of Surgery.  2.  If you choose to wash your hair, wash your hair first as usual with your  normal  shampoo.  3.  After you shampoo, rinse your hair and body thoroughly to remove the  shampoo.                           4.  Use CHG as you would any other liquid soap.  You can apply chg directly  to the skin and wash                       Gently with a scrungie or clean washcloth.  5.  Apply the CHG Soap to your body ONLY FROM THE NECK DOWN.   Do not use on face/ open                           Wound or open sores. Avoid contact with eyes, ears mouth and genitals (private parts).                       Wash face,  Genitals (private parts) with your normal soap.             6.  Wash thoroughly, paying special attention to the area where your surgery  will be performed.  7.  Thoroughly rinse your body with warm water from the neck down.  8.  DO NOT shower/wash with your normal soap after using and rinsing off  the CHG Soap.                9.  Pat yourself dry with a clean towel.            10.  Wear clean pajamas.            11.  Place clean sheets on your bed the night of your first shower and do not  sleep with pets. Day of Surgery : Do not apply any lotions/deodorants the morning of surgery.  Please wear clean clothes to the hospital/surgery center.  FAILURE TO FOLLOW THESE INSTRUCTIONS MAY RESULT IN THE CANCELLATION OF YOUR SURGERY PATIENT SIGNATURE_________________________________  NURSE SIGNATURE__________________________________  ________________________________________________________________________   [name]  An incentive spirometer is a tool that can help keep your lungs clear and active. This tool measures how well you are filling your lungs with each breath. Taking long deep breaths may help reverse or decrease the  chance of developing breathing (pulmonary) problems (especially infection) following:  A long period of time when you are unable to move or be active. BEFORE THE PROCEDURE   If the spirometer includes an indicator to show your best effort, your nurse or respiratory therapist will set it to a desired goal.  If possible, sit up straight or lean slightly forward. Try not to slouch.  Hold the incentive spirometer in an upright position. INSTRUCTIONS FOR USE  1. Sit on the edge of your bed if possible, or sit up as far as you can in bed or on a chair. 2. Hold the incentive spirometer in an upright position. 3. Breathe out normally. 4. Place the mouthpiece in your mouth and seal your lips tightly around it. 5. Breathe in slowly and as deeply as possible, raising the piston or the ball toward the top of the column. 6. Hold your breath for 3-5 seconds or for as long as possible. Allow the piston or ball to fall to the bottom of the column. 7. Remove the mouthpiece from your mouth and breathe out normally. 8. Rest for a few seconds and repeat Steps 1 through 7 at least 10 times every 1-2 hours when you are awake. Take your time and take a few normal breaths between deep breaths. 9. The spirometer may include an indicator to show your best effort. Use the indicator as a goal to work toward during each repetition. 10. After each set of 10 deep breaths, practice coughing to be sure your lungs are clear. If you have an incision (the cut made at the time of surgery), support your incision when coughing by placing a  pillow or rolled up towels firmly against it. Once you are able to get out of bed, walk around indoors and cough well. You may stop using the incentive spirometer when instructed by your caregiver.  RISKS AND COMPLICATIONS  Take your time so you do not get dizzy or light-headed.  If you are in pain, you may need to take or ask for pain medication before doing incentive spirometry. It is harder  to take a deep breath if you are having pain. AFTER USE  Rest and breathe slowly and easily.  It can be helpful to keep track of a log of your progress. Your caregiver can provide you with a simple table to help with this. If you are using the spirometer at home, follow these instructions: SEEK MEDICAL CARE IF:   You are having difficultly using the spirometer.  You have trouble using the spirometer as often as instructed.  Your pain medication is not giving enough relief while using the spirometer.  You develop fever of 100.5 F (38.1 C) or higher. SEEK IMMEDIATE MEDICAL CARE IF:   You cough up bloody sputum that had not been present before.  You develop fever of 102 F (38.9 C) or greater.  You develop worsening pain at or near the incision site. MAKE SURE YOU:   Understand these instructions.  Will watch your condition.  Will get help right away if you are not doing well or get worse. Document Released: 01/12/2007 Document Revised: 11/24/2011 Document Reviewed: 03/15/2007 [facility] Patient Information 2014 [place], [place].   ________________________________________________________________________

## 2016-10-24 ENCOUNTER — Encounter (HOSPITAL_COMMUNITY): Payer: Self-pay

## 2016-10-24 ENCOUNTER — Encounter (HOSPITAL_COMMUNITY)
Admission: RE | Admit: 2016-10-24 | Discharge: 2016-10-24 | Disposition: A | Discharge status: Home / Self Care | Payer: Managed Care, Other (non HMO) | Source: Ambulatory Visit | Attending: Surgery | Admitting: Surgery

## 2016-10-24 DIAGNOSIS — K8050 Calculus of bile duct without cholangitis or cholecystitis without obstruction: Secondary | ICD-10-CM | POA: Diagnosis not present

## 2016-10-24 DIAGNOSIS — Z9884 Bariatric surgery status: Secondary | ICD-10-CM | POA: Insufficient documentation

## 2016-10-24 DIAGNOSIS — Z01812 Encounter for preprocedural laboratory examination: Secondary | ICD-10-CM | POA: Diagnosis present

## 2016-10-24 HISTORY — DX: Calculus of bile duct without cholangitis or cholecystitis without obstruction: K80.50

## 2016-10-24 LAB — BASIC METABOLIC PANEL
ANION GAP: 8 (ref 5–15)
BUN: 23 mg/dL — ABNORMAL HIGH (ref 6–20)
CO2: 26 mmol/L (ref 22–32)
Calcium: 8.9 mg/dL (ref 8.9–10.3)
Chloride: 104 mmol/L (ref 101–111)
Creatinine, Ser: 1.11 mg/dL — ABNORMAL HIGH (ref 0.44–1.00)
GFR, EST NON AFRICAN AMERICAN: 54 mL/min — AB (ref >60)
GLUCOSE: 98 mg/dL (ref 65–99)
POTASSIUM: 4.3 mmol/L (ref 3.5–5.1)
Sodium: 138 mmol/L (ref 135–145)

## 2016-10-24 LAB — CBC
HCT: 36 % (ref 36.0–46.0)
HEMOGLOBIN: 11.8 g/dL — AB (ref 12.0–15.0)
MCH: 30 pg (ref 26.0–34.0)
MCHC: 32.8 g/dL (ref 30.0–36.0)
MCV: 91.6 fL (ref 78.0–100.0)
Platelets: 229 10*3/uL (ref 150–400)
RBC: 3.93 MIL/uL (ref 3.87–5.11)
RDW: 14.7 % (ref 11.5–15.5)
WBC: 4.5 10*3/uL (ref 4.0–10.5)

## 2016-10-24 NOTE — Progress Notes (Signed)
07/11/2016- noted in EPIC- EKG

## 2016-10-28 ENCOUNTER — Other Ambulatory Visit: Payer: Self-pay | Admitting: Gastroenterology

## 2016-10-29 ENCOUNTER — Observation Stay (HOSPITAL_COMMUNITY)
Admission: RE | Admit: 2016-10-29 | Discharge: 2016-10-30 | Disposition: A | Discharge status: Home / Self Care | Payer: Managed Care, Other (non HMO) | Source: Ambulatory Visit | Attending: Surgery | Admitting: Surgery

## 2016-10-29 ENCOUNTER — Ambulatory Visit: Payer: Self-pay | Admitting: Surgery

## 2016-10-29 ENCOUNTER — Encounter (HOSPITAL_COMMUNITY): Payer: Self-pay | Admitting: *Deleted

## 2016-10-29 ENCOUNTER — Ambulatory Visit (HOSPITAL_COMMUNITY): Payer: Managed Care, Other (non HMO) | Admitting: Anesthesiology

## 2016-10-29 ENCOUNTER — Encounter (HOSPITAL_COMMUNITY)
Admission: RE | Disposition: A | Discharge status: Home / Self Care | Payer: Self-pay | Source: Ambulatory Visit | Attending: Surgery

## 2016-10-29 ENCOUNTER — Ambulatory Visit (HOSPITAL_COMMUNITY): Payer: Managed Care, Other (non HMO)

## 2016-10-29 DIAGNOSIS — K219 Gastro-esophageal reflux disease without esophagitis: Secondary | ICD-10-CM | POA: Diagnosis not present

## 2016-10-29 DIAGNOSIS — Z431 Encounter for attention to gastrostomy: Secondary | ICD-10-CM | POA: Insufficient documentation

## 2016-10-29 DIAGNOSIS — I10 Essential (primary) hypertension: Secondary | ICD-10-CM | POA: Insufficient documentation

## 2016-10-29 DIAGNOSIS — K8070 Calculus of gallbladder and bile duct without cholecystitis without obstruction: Secondary | ICD-10-CM | POA: Diagnosis present

## 2016-10-29 DIAGNOSIS — Z419 Encounter for procedure for purposes other than remedying health state, unspecified: Secondary | ICD-10-CM

## 2016-10-29 DIAGNOSIS — Z9884 Bariatric surgery status: Secondary | ICD-10-CM | POA: Diagnosis not present

## 2016-10-29 DIAGNOSIS — Z885 Allergy status to narcotic agent status: Secondary | ICD-10-CM | POA: Diagnosis not present

## 2016-10-29 DIAGNOSIS — Z720 Tobacco use: Secondary | ICD-10-CM | POA: Insufficient documentation

## 2016-10-29 DIAGNOSIS — K660 Peritoneal adhesions (postprocedural) (postinfection): Secondary | ICD-10-CM | POA: Insufficient documentation

## 2016-10-29 DIAGNOSIS — K8050 Calculus of bile duct without cholangitis or cholecystitis without obstruction: Secondary | ICD-10-CM | POA: Diagnosis present

## 2016-10-29 DIAGNOSIS — I739 Peripheral vascular disease, unspecified: Secondary | ICD-10-CM | POA: Insufficient documentation

## 2016-10-29 HISTORY — PX: LAPAROSCOPIC GASTROSTOMY: SHX5896

## 2016-10-29 HISTORY — PX: ERCP: SHX5425

## 2016-10-29 LAB — CREATININE, SERUM
Creatinine, Ser: 1.09 mg/dL — ABNORMAL HIGH (ref 0.44–1.00)
GFR, EST NON AFRICAN AMERICAN: 55 mL/min — AB (ref >60)

## 2016-10-29 LAB — CBC
HEMATOCRIT: 34.6 % — AB (ref 36.0–46.0)
Hemoglobin: 11.3 g/dL — ABNORMAL LOW (ref 12.0–15.0)
MCH: 30.1 pg (ref 26.0–34.0)
MCHC: 32.7 g/dL (ref 30.0–36.0)
MCV: 92.3 fL (ref 78.0–100.0)
Platelets: 193 10*3/uL (ref 150–400)
RBC: 3.75 MIL/uL — AB (ref 3.87–5.11)
RDW: 14.9 % (ref 11.5–15.5)
WBC: 6.9 10*3/uL (ref 4.0–10.5)

## 2016-10-29 SURGERY — CREATION, GASTROSTOMY, LAPAROSCOPIC
Anesthesia: General | Site: Abdomen

## 2016-10-29 MED ORDER — SUGAMMADEX SODIUM 200 MG/2ML IV SOLN
INTRAVENOUS | Status: AC
  Filled 2016-10-29: qty 2

## 2016-10-29 MED ORDER — FENTANYL CITRATE (PF) 100 MCG/2ML IJ SOLN
INTRAMUSCULAR | Status: AC
  Administered 2016-10-29: 50 ug via INTRAVENOUS
  Filled 2016-10-29: qty 2

## 2016-10-29 MED ORDER — FENTANYL CITRATE (PF) 100 MCG/2ML IJ SOLN
INTRAMUSCULAR | Status: AC
  Filled 2016-10-29: qty 2

## 2016-10-29 MED ORDER — HEPARIN SODIUM (PORCINE) 5000 UNIT/ML IJ SOLN
5000.0000 [IU] | Freq: Once | INTRAMUSCULAR | Status: AC
  Administered 2016-10-29: 5000 [IU] via SUBCUTANEOUS
  Filled 2016-10-29: qty 1

## 2016-10-29 MED ORDER — CEFOTETAN DISODIUM-DEXTROSE 2-2.08 GM-% IV SOLR
INTRAVENOUS | Status: AC
  Filled 2016-10-29: qty 50

## 2016-10-29 MED ORDER — LIDOCAINE 2% (20 MG/ML) 5 ML SYRINGE
INTRAMUSCULAR | Status: AC
  Filled 2016-10-29: qty 5

## 2016-10-29 MED ORDER — DULOXETINE HCL 60 MG PO CPEP: 60.0000 mg | ORAL_CAPSULE | Freq: Every day | ORAL | Status: DC | PRN

## 2016-10-29 MED ORDER — FENTANYL CITRATE (PF) 100 MCG/2ML IJ SOLN
25.0000 ug | INTRAMUSCULAR | Status: DC | PRN
  Administered 2016-10-29: 50 ug via INTRAVENOUS

## 2016-10-29 MED ORDER — BUPIVACAINE LIPOSOME 1.3 % IJ SUSP
INTRAMUSCULAR | Status: DC | PRN
  Administered 2016-10-29: 20 mL

## 2016-10-29 MED ORDER — SODIUM CHLORIDE 0.9 % IJ SOLN
INTRAMUSCULAR | Status: AC
  Filled 2016-10-29: qty 10

## 2016-10-29 MED ORDER — CHLORHEXIDINE GLUCONATE CLOTH 2 % EX PADS: 6.0000 | MEDICATED_PAD | Freq: Once | CUTANEOUS | Status: DC

## 2016-10-29 MED ORDER — LACTATED RINGERS IV SOLN
INTRAVENOUS | Status: DC
  Administered 2016-10-29: 1000 mL via INTRAVENOUS
  Administered 2016-10-29: 13:00:00 via INTRAVENOUS

## 2016-10-29 MED ORDER — SODIUM CHLORIDE 0.9 % IV SOLN: INTRAVENOUS | Status: DC

## 2016-10-29 MED ORDER — ONDANSETRON HCL 4 MG/2ML IJ SOLN
INTRAMUSCULAR | Status: DC | PRN
  Administered 2016-10-29: 4 mg via INTRAVENOUS

## 2016-10-29 MED ORDER — PANTOPRAZOLE SODIUM 40 MG IV SOLR
40.0000 mg | Freq: Every day | INTRAVENOUS | Status: DC
  Administered 2016-10-29: 40 mg via INTRAVENOUS
  Filled 2016-10-29: qty 40

## 2016-10-29 MED ORDER — HEPARIN SODIUM (PORCINE) 5000 UNIT/ML IJ SOLN
5000.0000 [IU] | Freq: Three times a day (TID) | INTRAMUSCULAR | Status: DC
  Administered 2016-10-29 – 2016-10-30 (×2): 5000 [IU] via SUBCUTANEOUS
  Filled 2016-10-29: qty 1

## 2016-10-29 MED ORDER — DEXAMETHASONE SODIUM PHOSPHATE 10 MG/ML IJ SOLN
INTRAMUSCULAR | Status: DC | PRN
  Administered 2016-10-29: 10 mg via INTRAVENOUS

## 2016-10-29 MED ORDER — PROMETHAZINE HCL 25 MG/ML IJ SOLN: 6.2500 mg | INTRAMUSCULAR | Status: DC | PRN

## 2016-10-29 MED ORDER — ONDANSETRON HCL 4 MG/2ML IJ SOLN
4.0000 mg | Freq: Four times a day (QID) | INTRAMUSCULAR | Status: DC | PRN
  Administered 2016-10-29: 4 mg via INTRAVENOUS
  Filled 2016-10-29: qty 2

## 2016-10-29 MED ORDER — LIDOCAINE 2% (20 MG/ML) 5 ML SYRINGE
INTRAMUSCULAR | Status: DC | PRN
  Administered 2016-10-29: 100 mg via INTRAVENOUS

## 2016-10-29 MED ORDER — ROCURONIUM BROMIDE 50 MG/5ML IV SOSY
PREFILLED_SYRINGE | INTRAVENOUS | Status: AC
  Filled 2016-10-29: qty 5

## 2016-10-29 MED ORDER — SUCCINYLCHOLINE CHLORIDE 200 MG/10ML IV SOSY
PREFILLED_SYRINGE | INTRAVENOUS | Status: AC
Start: 2016-10-29 — End: 2016-10-29
  Filled 2016-10-29: qty 10

## 2016-10-29 MED ORDER — SUGAMMADEX SODIUM 200 MG/2ML IV SOLN
INTRAVENOUS | Status: DC | PRN
  Administered 2016-10-29: 200 mg via INTRAVENOUS

## 2016-10-29 MED ORDER — LISINOPRIL 10 MG PO TABS
10.0000 mg | ORAL_TABLET | Freq: Every day | ORAL | Status: DC
  Filled 2016-10-29: qty 1

## 2016-10-29 MED ORDER — PROPOFOL 10 MG/ML IV BOLUS
INTRAVENOUS | Status: DC | PRN
  Administered 2016-10-29: 170 mg via INTRAVENOUS

## 2016-10-29 MED ORDER — SODIUM CHLORIDE 0.9 % IJ SOLN
INTRAMUSCULAR | Status: DC | PRN
  Administered 2016-10-29: 10 mL via INTRAVENOUS

## 2016-10-29 MED ORDER — LISINOPRIL-HYDROCHLOROTHIAZIDE 10-12.5 MG PO TABS: 1.0000 | ORAL_TABLET | Freq: Every day | ORAL | Status: DC

## 2016-10-29 MED ORDER — MIDAZOLAM HCL 5 MG/5ML IJ SOLN
INTRAMUSCULAR | Status: DC | PRN
  Administered 2016-10-29: 2 mg via INTRAVENOUS

## 2016-10-29 MED ORDER — CEFOTETAN DISODIUM-DEXTROSE 2-2.08 GM-% IV SOLR
2.0000 g | Freq: Once | INTRAVENOUS | Status: AC
  Administered 2016-10-29: 2 g via INTRAVENOUS

## 2016-10-29 MED ORDER — PROPOFOL 10 MG/ML IV BOLUS
INTRAVENOUS | Status: AC
  Filled 2016-10-29: qty 20

## 2016-10-29 MED ORDER — SUCCINYLCHOLINE CHLORIDE 200 MG/10ML IV SOSY
PREFILLED_SYRINGE | INTRAVENOUS | Status: DC | PRN
  Administered 2016-10-29: 120 mg via INTRAVENOUS

## 2016-10-29 MED ORDER — FENTANYL CITRATE (PF) 100 MCG/2ML IJ SOLN
INTRAMUSCULAR | Status: DC | PRN
  Administered 2016-10-29: 100 ug via INTRAVENOUS
  Administered 2016-10-29 (×4): 50 ug via INTRAVENOUS

## 2016-10-29 MED ORDER — HYDROCHLOROTHIAZIDE 12.5 MG PO CAPS: 12.5000 mg | ORAL_CAPSULE | Freq: Every day | ORAL | Status: DC

## 2016-10-29 MED ORDER — ROCURONIUM BROMIDE 10 MG/ML (PF) SYRINGE
PREFILLED_SYRINGE | INTRAVENOUS | Status: DC | PRN
  Administered 2016-10-29: 10 mg via INTRAVENOUS
  Administered 2016-10-29: 40 mg via INTRAVENOUS

## 2016-10-29 MED ORDER — DEXAMETHASONE SODIUM PHOSPHATE 10 MG/ML IJ SOLN
INTRAMUSCULAR | Status: AC
  Filled 2016-10-29: qty 1

## 2016-10-29 MED ORDER — MIDAZOLAM HCL 2 MG/2ML IJ SOLN
INTRAMUSCULAR | Status: AC
  Filled 2016-10-29: qty 2

## 2016-10-29 MED ORDER — IOPAMIDOL (ISOVUE-300) INJECTION 61%
INTRAVENOUS | Status: AC
  Filled 2016-10-29: qty 50

## 2016-10-29 MED ORDER — DEXTROSE 5 % IV SOLN
2.0000 g | Freq: Two times a day (BID) | INTRAVENOUS | Status: AC
  Administered 2016-10-29: 2 g via INTRAVENOUS
  Filled 2016-10-29: qty 2

## 2016-10-29 MED ORDER — ONDANSETRON HCL 4 MG/2ML IJ SOLN
INTRAMUSCULAR | Status: AC
  Filled 2016-10-29: qty 2

## 2016-10-29 MED ORDER — KCL IN DEXTROSE-NACL 20-5-0.45 MEQ/L-%-% IV SOLN
INTRAVENOUS | Status: DC
  Administered 2016-10-29 – 2016-10-30 (×2): 75 mL/h via INTRAVENOUS
  Filled 2016-10-29 (×2): qty 1000

## 2016-10-29 MED ORDER — SODIUM CHLORIDE 0.9 % IV SOLN
INTRAVENOUS | Status: DC | PRN
  Administered 2016-10-29: 40 mL

## 2016-10-29 MED ORDER — ONDANSETRON 4 MG PO TBDP: 4.0000 mg | ORAL_TABLET | Freq: Four times a day (QID) | ORAL | Status: DC | PRN

## 2016-10-29 MED ORDER — FENTANYL CITRATE (PF) 100 MCG/2ML IJ SOLN
12.5000 ug | INTRAMUSCULAR | Status: DC | PRN
  Administered 2016-10-29 – 2016-10-30 (×3): 12.5 ug via INTRAVENOUS
  Filled 2016-10-29 (×3): qty 2

## 2016-10-29 MED ORDER — LACTATED RINGERS IR SOLN
Status: DC | PRN
  Administered 2016-10-29: 1000 mL

## 2016-10-29 MED ORDER — 0.9 % SODIUM CHLORIDE (POUR BTL) OPTIME
TOPICAL | Status: DC | PRN
  Administered 2016-10-29: 1000 mL

## 2016-10-29 SURGICAL SUPPLY — 49 items
BENZOIN TINCTURE PRP APPL 2/3 (GAUZE/BANDAGES/DRESSINGS) IMPLANT
CABLE HIGH FREQUENCY MONO STRZ (ELECTRODE) ×4 IMPLANT
CHLORAPREP W/TINT 26ML (MISCELLANEOUS) IMPLANT
COVER SURGICAL LIGHT HANDLE (MISCELLANEOUS) ×4 IMPLANT
DERMABOND ADVANCED (GAUZE/BANDAGES/DRESSINGS) ×2
DERMABOND ADVANCED .7 DNX12 (GAUZE/BANDAGES/DRESSINGS) ×2 IMPLANT
DEVICE SUT QUICK LOAD TK 5 (STAPLE) IMPLANT
DEVICE SUT TI-KNOT TK 5X26 (MISCELLANEOUS) IMPLANT
DEVICE SUTURE ENDOST 10MM (ENDOMECHANICALS) IMPLANT
DEVICE TI KNOT TK5 (MISCELLANEOUS)
DEVICE TROCAR PUNCTURE CLOSURE (ENDOMECHANICALS) IMPLANT
DRAPE LAPAROSCOPIC ABDOMINAL (DRAPES) ×4 IMPLANT
DRAPE LG THREE QUARTER DISP (DRAPES) ×4 IMPLANT
ELECT REM PT RETURN 9FT ADLT (ELECTROSURGICAL) ×4
ELECTRODE REM PT RTRN 9FT ADLT (ELECTROSURGICAL) ×2 IMPLANT
GAUZE SPONGE 2X2 8PLY STRL LF (GAUZE/BANDAGES/DRESSINGS) IMPLANT
GAUZE SPONGE 4X4 12PLY STRL (GAUZE/BANDAGES/DRESSINGS) IMPLANT
GOWN STRL REUS W/TWL XL LVL3 (GOWN DISPOSABLE) ×16 IMPLANT
HANDLE STAPLE EGIA 4 XL (STAPLE) ×4 IMPLANT
HOLDER FOLEY CATH W/STRAP (MISCELLANEOUS) ×4 IMPLANT
IRRIG SUCT STRYKERFLOW 2 WTIP (MISCELLANEOUS)
IRRIGATION SUCT STRKRFLW 2 WTP (MISCELLANEOUS) IMPLANT
KIT BASIN OR (CUSTOM PROCEDURE TRAY) ×4 IMPLANT
PAD POSITIONING PINK XL (MISCELLANEOUS) ×4 IMPLANT
POSITIONER SURGICAL ARM (MISCELLANEOUS) IMPLANT
QUICK LOAD TK 5 (STAPLE)
RELOAD EGIA 60 MED/THCK PURPLE (STAPLE) ×4 IMPLANT
SCISSORS LAP 5X35 DISP (ENDOMECHANICALS) ×4 IMPLANT
SET IRRIG TUBING LAPAROSCOPIC (IRRIGATION / IRRIGATOR) ×4 IMPLANT
SHEARS HARMONIC ACE PLUS 36CM (ENDOMECHANICALS) ×4 IMPLANT
SLEEVE XCEL OPT CAN 5 100 (ENDOMECHANICALS) ×8 IMPLANT
SPONGE DRAIN TRACH 4X4 STRL 2S (GAUZE/BANDAGES/DRESSINGS) IMPLANT
SPONGE GAUZE 2X2 STER 10/PKG (GAUZE/BANDAGES/DRESSINGS)
STAPLER VISISTAT 35W (STAPLE) ×4 IMPLANT
SUT ETHILON 2 0 PS N (SUTURE) IMPLANT
SUT MNCRL AB 4-0 PS2 18 (SUTURE) IMPLANT
SUT PROLENE 2 0 SH DA (SUTURE) IMPLANT
SUT SILK 2 0 SH (SUTURE) IMPLANT
SUT SURGIDAC NAB ES-9 0 48 120 (SUTURE) ×8 IMPLANT
TAPE CLOTH 4X10 WHT NS (GAUZE/BANDAGES/DRESSINGS) IMPLANT
TOWEL OR 17X26 10 PK STRL BLUE (TOWEL DISPOSABLE) ×8 IMPLANT
TOWEL OR NON WOVEN STRL DISP B (DISPOSABLE) ×4 IMPLANT
TRAY FOLEY CATH 14FRSI W/METER (CATHETERS) ×4 IMPLANT
TRAY LAPAROSCOPIC (CUSTOM PROCEDURE TRAY) ×4 IMPLANT
TROCAR ADV FIXATION 12X100MM (TROCAR) ×4 IMPLANT
TROCAR BLADELESS OPT 5 100 (ENDOMECHANICALS) ×4 IMPLANT
TROCAR XCEL NON-BLD 11X100MML (ENDOMECHANICALS) IMPLANT
TUBE MOSS GAS 18FR (TUBING) IMPLANT
TUBING INSUF HEATED (TUBING) ×4 IMPLANT

## 2016-10-29 NOTE — Anesthesia Procedure Notes (Signed)
Procedure Name: Intubation Date/Time: 10/29/2016 12:16 PM Performed by: Lind Covert Pre-anesthesia Checklist: Patient identified, Emergency Drugs available, Suction available and Patient being monitored Patient Re-evaluated:Patient Re-evaluated prior to inductionOxygen Delivery Method: Circle system utilized Preoxygenation: Pre-oxygenation with 100% oxygen Intubation Type: IV induction Laryngoscope Size: Mac and 4 Grade View: Grade I Tube type: Oral Tube size: 7.5 mm Number of attempts: 1 Airway Equipment and Method: Stylet Placement Confirmation: ETT inserted through vocal cords under direct vision,  positive ETCO2 and breath sounds checked- equal and bilateral Secured at: 22 cm Tube secured with: Tape Dental Injury: Teeth and Oropharynx as per pre-operative assessment

## 2016-10-29 NOTE — Anesthesia Postprocedure Evaluation (Signed)
Anesthesia Post Note  Patient: Stacy HendersonLori Lane  Procedure(s) Performed: Procedure(s) (LRB): LAPAROSCOPIC GASTROSTOMY AND CLOSURE OF GASTROSTOMY (N/A) ENDOSCOPIC RETROGRADE CHOLANGIOPANCREATOGRAPHY (ERCP) (N/A)  Patient location during evaluation: PACU Anesthesia Type: General Level of consciousness: awake and alert Pain management: pain level controlled Vital Signs Assessment: post-procedure vital signs reviewed and stable Respiratory status: spontaneous breathing, nonlabored ventilation, respiratory function stable and patient connected to nasal cannula oxygen Cardiovascular status: blood pressure returned to baseline and stable Postop Assessment: no signs of nausea or vomiting Anesthetic complications: no       Last Vitals:  Vitals:   10/29/16 1545 10/29/16 1600  BP: (!) 151/88 (!) 160/82  Pulse: 75 79  Resp: 19 14  Temp:  36.6 C    Last Pain:  Vitals:   10/29/16 1600  TempSrc:   PainSc: 3                  Kennieth RadFitzgerald, Gerrad Welker E

## 2016-10-29 NOTE — Transfer of Care (Signed)
Immediate Anesthesia Transfer of Care Note  Patient: Stacy HendersonLori Belsky  Procedure(s) Performed: Procedure(s): LAPAROSCOPIC GASTROSTOMY AND CLOSURE OF GASTROSTOMY (N/A) ENDOSCOPIC RETROGRADE CHOLANGIOPANCREATOGRAPHY (ERCP) (N/A)  Patient Location: PACU  Anesthesia Type:General  Level of Consciousness:  Alert, patient cooperative and responds to stimulation  Airway & Oxygen Therapy:Patient Spontanous Breathing and Patient connected to face mask oxgen  Post-op Assessment:  Report given to PACU RN and Post -op Vital signs reviewed and stable  Post vital signs:  Reviewed and stable  Last Vitals:  Vitals:   10/29/16 1017  BP: 139/66  Pulse: 83  Resp: 16  Temp: 37.3 C    Complications: No apparent anesthesia complications

## 2016-10-29 NOTE — Interval H&P Note (Signed)
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 After consideration of risks, benefits and other options for treatment, the patient has consented to  Procedure(s): LAPAROSCOPIC GASTROSTOMY WITH POSSIBLE G TUBE PLACEMENT (N/A) ENDOSCOPIC RETROGRADE CHOLANGIOPANCREATOGRAPHY (ERCP) (N/A) as a surgical intervention .  The patient's history has been reviewed, patient examined, no change in status, stable for surgery.  I have reviewed the patient's chart and labs.  Questions were answered to the patient's satisfaction.     Caetano Oberhaus B

## 2016-10-29 NOTE — Brief Op Note (Signed)
10/29/2016  3:03 PM  PATIENT:  Stacy Lane  61 y.o. 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   PROCEDURE:  Procedure(s): LAPAROSCOPIC GASTROSTOMY (N/A) ENDOSCOPIC RETROGRADE CHOLANGIOPANCREATOGRAPHY (ERCP) (N/A)  SURGEON:  Surgeon(s) and Role:    * Luretha MurphyMatthew Deshay Blumenfeld, MD - Primary    * Vida RiggerMarc Magod, MD - Assisting  PHYSICIAN ASSISTANT:   ASSISTANTS: Feliciana RossettiLuke Kinsinger, MD   ANESTHESIA:   general  EBL:  Total I/O In: 1000 [I.V.:1000] Out: 320 [Urine:300; Blood:20]  BLOOD ADMINISTERED:none  DRAINS: none   LOCAL MEDICATIONS USED:  BUPIVICAINE   SPECIMEN:  No Specimen  DISPOSITION OF SPECIMEN:  N/A  COUNTS:  YES  TOURNIQUET:  * No tourniquets in log *  DICTATION: .Other Dictation: Dictation Number [id number]  PLAN OF CARE: Admit for overnight observation  PATIENT DISPOSITION:  PACU - hemodynamically stable.   Delay start of Pharmacological VTE agent (>24hrs) due to surgical blood loss or risk of bleeding: no

## 2016-10-29 NOTE — Op Note (Signed)
[facility] Patient Name: Stacy Lane Procedure Date: 10/29/2016 MRN: [id number] Attending MD: Vida Rigger , MD Date of Birth: 1956/06/10 CSN: [id number] Age: 61 Admit Type: Outpatient Procedure:                ERCP Indications:              Bile duct stone(s) Providers:                Vida Rigger, MD, Arlee Muslim Tech., Technician Referring MD:             Dr. Daphine Deutscher Medicines:                General Anesthesia Complications:            No immediate complications. Estimated Blood Loss:     Estimated blood loss was minimal. Procedure:                Pre-Anesthesia Assessment:                           - Prior to the procedure, a History and Physical                            was performed, and patient medications and                            allergies were reviewed. The patient's tolerance of                            previous anesthesia was also reviewed. The risks                            and benefits of the procedure and the sedation                            options and risks were discussed with the patient.                            All questions were answered, and informed consent                            was obtained. Prior Anticoagulants: The patient has                            taken no previous anticoagulant or antiplatelet                            agents. ASA Grade Assessment: II - A patient with                            mild systemic disease. After reviewing the risks                            and benefits, the patient was deemed in  satisfactory condition to undergo the procedure.                           After obtaining informed consent, the scope was                            passed under direct vision. Throughout the                            procedure, the patient's blood pressure, pulse, and                            oxygen saturations were monitored continuously. The                            [id number]  [id number]) scope was introduced through                            the gastrostomy, and used to inject contrast into                            and used to locate the major papilla. The ERCP was                            somewhat difficult due to unusual anatomy mostly                            difficulty with the fluoroscopy images due to                            patient's position and additional hardware. The                            patient tolerated the procedure well. Scope In: Scope Out: Findings:      The major papilla was normal. there were no pancreatic duct wire       advancement or injection throughout the procedureand deep selective       cannulation was obtained on the first attempt and the CBD stones were       confirmed on minimal injection and the wire was advanced into the       intrahepatic and then we proceed with the Biliary sphincterotomy which       was made with a Hydratome sphincterotome using ERBE electrocautery until       we had adequate biliary drainage and could get the fully bowed       sphincterotome easily in and out of the duct. There was no       post-sphincterotomy bleeding however there was minimal bleeding after       one balloon pull-through and stone removal which stop readily without       intervention. and with difficulty pulling stones and the larger size       balloons through the patent sphincterotomy site we increase the       sphincterotomy a little in the customary fashion and throughout the       procedure proceeded with  sweeping The biliary tree with an 12 mm       balloon, at the end of the procedure to inject below andinitially and in       the middle using the 15 mm balloon and 18 mm balloon starting at the       upper third of the main bile duct. Sludge was swept from the duct.       Multiple stones were removed. No stones remained on multiple occlusion       cholangiograms and there was adequate biliary drainage and the scope was        removed with the surgical team's help and then they finished their       operation. Impression:               - The major papilla appeared normal.                           - Choledocholithiasis was found. Complete removal                            was accomplished by biliary sphincterotomy and                            multiple balloon extractions as above.                           - A biliary sphincterotomy was performed x 2.                           - The biliary tree was swept ultiple times. no                            pancreatic duct injection or wire advancement                            throughout the procedure Moderate Sedation:      N/A- Per Anesthesia Care Recommendation:           - Advance diet as per surgical team and as                            tolerated.                           - Continue present medications. probable recheck                            labs in a.m. and will ask my partner Dr. Randa EvensEdwards to                            check on patient                           - Return to GI clinic PRN.                           - Telephone GI clinic if symptomatic PRN. Procedure Code(s):        ---  Professional ---                           [phone], Unlisted procedure, biliary tract Diagnosis Code(s):        --- Professional ---                           K80.50, Calculus of bile duct without cholangitis                            or cholecystitis without obstruction CPT copyright 2016 American Medical Association. All rights reserved. The codes documented in this report are preliminary and upon coder review may  be revised to meet current compliance requirements. Vida Rigger, MD 10/29/2016 3:07:08 PM This report has been signed electronically. Number of Addenda: 0

## 2016-10-29 NOTE — Anesthesia Preprocedure Evaluation (Addendum)
Anesthesia Evaluation  Patient identified by MRN, date of birth, ID band Patient awake    Reviewed: Allergy & Precautions, NPO status , Patient's Chart, lab work & pertinent test results  Airway Mallampati: II  TM Distance: >3 FB     Dental   Pulmonary pneumonia, Current Smoker,    breath sounds clear to auscultation       Cardiovascular hypertension, + Peripheral Vascular Disease   Rhythm:Regular Rate:Normal     Neuro/Psych    GI/Hepatic Neg liver ROS, GERD  ,History noted. CG   Endo/Other  negative endocrine ROS  Renal/GU negative Renal ROS     Musculoskeletal   Abdominal   Peds  Hematology   Anesthesia Other Findings   Reproductive/Obstetrics                             Anesthesia Physical Anesthesia Plan  ASA: III  Anesthesia Plan: General   Post-op Pain Management:    Induction: Intravenous  Airway Management Planned: Oral ETT  Additional Equipment:   Intra-op Plan:   Post-operative Plan: Possible Post-op intubation/ventilation  Informed Consent: I have reviewed the patients History and Physical, chart, labs and discussed the procedure including the risks, benefits and alternatives for the proposed anesthesia with the patient or authorized representative who has indicated his/her understanding and acceptance.   Dental advisory given  Plan Discussed with: CRNA and Anesthesiologist  Anesthesia Plan Comments:         Anesthesia Quick Evaluation

## 2016-10-29 NOTE — H&P (Signed)
Anibal HendersonLori Delpizzo Location: [facility] Patient #: [id number] DOB: 08-Dec-1955 Divorced / Language: [place] / Race: White Female   History of Present Illness  The patient is a 61 year old female who presents for evaluation of gall stones. I reviewed her recent CT scan with her. She has gallstones and elevated alkaline phosphatase up to 600 on some studies. We have talked about doing a laparoscopic cholecystectomy before but she had decided against that. She says she does feel tired and gets bloated at times and just has been feeling worse the last few months. [place] gastric [place]  I recommended laparoscopic cholecystectomy with intraoperative cholangiogram and with probably a liver biopsy. After much discussion we decided to go and proceed with that. Procedure was explained to her in some detail. This was done on Halloween 2017 and CBD stones were found.  At time of followup she was apprised of the situation and need for ERCP.  A MRCP confirmed distal cbd stones in late January and she is set up for combined laparoscopic gastric remanant access so Dr. Ewing SchleinMagod to do intraop ERCP.    Allergies Codeine Phosphate *ANALGESICS - OPIOID*  Medication History  Vyvanse (60MG  Capsule, Oral) Active. Lisinopril-Hydrochlorothiazide (20-25MG  Tablet, Oral) Active. Medications Reconciled  Vitals  01/24/2016 10:23 AM Weight: 160 lb Height: 66in Body Surface Area: 1.82 m Body Mass Index: 25.82 kg/m  Temp.: 97.31F  Pulse: 115 (Regular)  BP: 122/86 (Sitting, Left Arm, Standard)   Physical Exam  The physical exam findings are as follows: Note:HEENT normal Neck supple Chest clear Heart SR without murmurs Abdomen nontender; prior open bypass Ext FROM.Marland Kitchen.wearing support hose for more recent fluid retention.   Assessment & Plan  SYMPTOMATIC CHOLEDOLITHIASIS  Impression: Will proceed with laparoscopic approach for ERCP.  Procedures explained to her in some detail. This had been done before as we were going to schedule.     Matt B. Daphine DeutscherMartin, MD, FACS

## 2016-10-30 ENCOUNTER — Encounter (HOSPITAL_COMMUNITY): Payer: Self-pay | Admitting: Surgery

## 2016-10-30 DIAGNOSIS — K8070 Calculus of gallbladder and bile duct without cholecystitis without obstruction: Secondary | ICD-10-CM | POA: Diagnosis not present

## 2016-10-30 LAB — COMPREHENSIVE METABOLIC PANEL
ALBUMIN: 3.2 g/dL — AB (ref 3.5–5.0)
ALT: 61 U/L — ABNORMAL HIGH (ref 14–54)
ANION GAP: 5 (ref 5–15)
AST: 95 U/L — AB (ref 15–41)
Alkaline Phosphatase: 576 U/L — ABNORMAL HIGH (ref 38–126)
BUN: 23 mg/dL — AB (ref 6–20)
CO2: 27 mmol/L (ref 22–32)
Calcium: 9.1 mg/dL (ref 8.9–10.3)
Chloride: 106 mmol/L (ref 101–111)
Creatinine, Ser: 1.1 mg/dL — ABNORMAL HIGH (ref 0.44–1.00)
GFR calc Af Amer: >60 mL/min (ref >60)
GFR calc non Af Amer: 54 mL/min — ABNORMAL LOW (ref >60)
GLUCOSE: 139 mg/dL — AB (ref 65–99)
POTASSIUM: 5.2 mmol/L — AB (ref 3.5–5.1)
SODIUM: 138 mmol/L (ref 135–145)
Total Bilirubin: 1 mg/dL (ref 0.3–1.2)
Total Protein: 6.8 g/dL (ref 6.5–8.1)

## 2016-10-30 LAB — CBC
HCT: 35.2 % — ABNORMAL LOW (ref 36.0–46.0)
HEMOGLOBIN: 11.4 g/dL — AB (ref 12.0–15.0)
MCH: 29.1 pg (ref 26.0–34.0)
MCHC: 32.4 g/dL (ref 30.0–36.0)
MCV: 89.8 fL (ref 78.0–100.0)
Platelets: 216 10*3/uL (ref 150–400)
RBC: 3.92 MIL/uL (ref 3.87–5.11)
RDW: 14.6 % (ref 11.5–15.5)
WBC: 6.2 10*3/uL (ref 4.0–10.5)

## 2016-10-30 LAB — LIPASE, BLOOD: Lipase: 25 U/L (ref 11–51)

## 2016-10-30 MED ORDER — HYDROCODONE-ACETAMINOPHEN 5-325 MG PO TABS: 1.0000 | ORAL_TABLET | ORAL | 0 refills | Status: AC | PRN

## 2016-10-30 NOTE — Op Note (Signed)
NAMMolly Lane:  Dubinsky, Rosellen                  ACCOUNT NO.:  [id number]  MEDICAL RECORD NO.:  [id number]  LOCATION:                                 FACILITY:  PHYSICIAN:  Thornton ParkMatthew B. 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  PROCEDURE:  Laparoscopy with enterolysis and creation of gastrostomy for endoscopic retrograde cholangiopancreatography access with subsequent closure of gastrotomy.  SURGEON:  Thornton ParkMatthew B. Daphine DeutscherMartin, MD.  ASSISTANT:  Feliciana RossettiLuke Kinsinger, MD.  ANESTHESIA:  General endotracheal.  DESCRIPTION OF PROCEDURE:  This 61 year old white [facility] and [facility] in supine position.  Her abdomen was prepped, then draped sterilely and time-out was performed.  I entered the abdomen through the left upper quadrant with a 0 degree 5-mm Optiview without difficulty.  Following insufflation, she was found to have a lot of adhesions to the midline and there was an area where it looked like maybe her stomach was stuck up to the anterior abdominal wall perhaps even an old G-tube.  I lysed adhesions and placed three more ports.  The one in the left upper quadrant was my opening incision, subsequently dilated it up and made it into the 12-mm balloon-tipped port for the subsequent G-tube.  With these three ports, we were able to take down adhesions and identify the anatomy.  Dr. Sheliah HatchKinsinger endoscoped the patient and we wanted to make sure that this was a retrocolic, retrogastric anastomosis and that this was in fact the defunctionalized limb.  I then entered it with a harmonic scalpel.  We placed a pursestring with the Endo Stitch.  A 12-mm balloon-tipped trocar was then advanced into this blown-up pullback.  Dr. Ewing SchleinMagod then performed the ERCP.  When the ERCP was completed, the trocar was removed.   A new clean sterile trocar was placed.  Through this, we advanced a 6-cm purple Covidien cartridge and elevated the gastrotomy.  We were able to close it with a single firing across this area of the gastric remnant, closing the gastrotomy.  The portion that was excised was removed.  The staple line was intact.  No evidence of any other leaks or any other things going on.  There was no bleeding.  I injected the port sites with Exparel.  They were closed with 4-0 Monocryl and Dermabond.  The patient tolerated the procedure well and was taken to the recovery room in satisfactory condition.  Dr. Ewing SchleinMagod was going to dictate his portion of the case separately.     Thornton ParkMatthew B. Daphine DeutscherMartin, MD   ______________________________ Thornton ParkMatthew B. Daphine DeutscherMartin, MD    MBM/MEDQ  D:  10/29/2016  T:  10/30/2016  Job:  [id number]  cc:   Petra KubaMarc E. Magod, M.D. Fax: [phone]

## 2016-10-30 NOTE — Discharge Summary (Signed)
Physician Discharge Summary  Patient ID: Stacy Lane MRN: [id number] DOB/AGE: 59-Mar-1957 59 y.o.  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  Multiple stones removed and sphincterotomy performed  Consults: none  Significant Diagnostic Studies: lipase normal postop    Discharge Exam: Blood pressure 129/80, pulse 66, temperature 98.2 F (36.8 C), temperature source Oral, resp. rate 16, height 5\' 6"  (1.676 m), weight 81.6 kg (180 lb), SpO2 99 %. Incisions oK  Disposition: 01-Home or Self Care  Discharge Instructions    Call MD for:  redness, tenderness, or signs of infection (pain, swelling, redness, odor or green/yellow discharge around incision site)    Complete by:  As directed    Call MD for:  redness, tenderness, or signs of infection (pain, swelling, redness, odor or green/yellow discharge around incision site)    Complete by:  As directed    Diet - low sodium heart healthy    Complete by:  As directed    Diet - low sodium heart healthy    Complete by:  As directed    Discharge instructions    Complete by:  As directed    May shower ad lib Stay on higher protein bariatric diet   Discharge wound care:    Complete by:  As directed    May shower ad lib Return to high protein bariatric diet   Increase activity slowly    Complete by:  As directed    Increase activity slowly    Complete by:  As directed      Allergies as of 10/30/2016      Reactions   Codeine Swelling, Rash      Medication List    TAKE these medications   diphenhydramine-acetaminophen 25-500 MG Tabs tablet Commonly known as:  TYLENOL PM Take 2 tablets by mouth at bedtime as needed (sleep). For  pain and sleep   DULoxetine 60 MG capsule Commonly known as:  CYMBALTA Take 60 mg by mouth daily as needed (pain and mood).   HYDROcodone-acetaminophen 5-325 MG tablet Commonly known as:  NORCO Take 1 tablet by mouth every 4 (four) hours as needed for moderate pain.   lisinopril-hydrochlorothiazide 10-12.5 MG tablet Commonly known as:  PRINZIDE,ZESTORETIC Take 1 tablet by mouth daily.   multivitamin with minerals Tabs tablet Take 1 tablet by mouth daily.   TYLENOL PO Take 2 tablets by mouth every 8 (eight) hours as needed (pain).   VYVANSE 70 MG capsule Generic drug:  lisdexamfetamine Take 70 mg by mouth See admin instructions. Only takes Monday -Friday      Follow-up Information    Tasheem Elms B, MD Follow up.   Specialty:  General Surgery Contact information: [address] [place] [address] [phone]        [facility], [facility] Follow up.   Specialty:  Gastroenterology Contact information: [address] [place] [address] [phone]           Signed: Valarie MerinoMARTIN,Toris Laverdiere B 10/30/2016, 8:29 AM

## 2016-10-30 NOTE — Progress Notes (Signed)
[facility] PROGRESS NOTE Subjective patient status post laparoscopic gastrostomy with ERCP via gastrostomy due to previous Roux-en-Y gastric bypass. She had choledocholithiasis with sphincterotomy and stone extraction. She feels terrific this morning and is hungry has had no abdominal pain. Has had bowel movement  Objective: Vital signs in last 24 hours: Temp:  [97.9 F (36.6 C)-99.1 F (37.3 C)] 98.2 F (36.8 C) (02/15 0439) Pulse Rate:  [66-88] 66 (02/15 0439) Resp:  [12-19] 16 (02/15 0439) BP: (129-174)/(60-98) 129/80 (02/15 0439) SpO2:  [99 %-100 %] 99 % (02/15 0439) Weight:  [81.6 kg (180 lb)] 81.6 kg (180 lb) (02/14 1028)    Intake/Output from previous day: 02/14 0701 - 02/15 0700 In: 1120 [P.O.:30; I.V.:1090] Out: 2320 [Urine:2300; Blood:20] Intake/Output this shift: No intake/output data recorded.  PE: General-- ambulating in the room no distress  Abdomen-- nondistended soft and nontender  Lab Results:  Recent Labs  10/29/16 1709 10/30/16 0428  WBC 6.9 6.2  HGB 11.3* 11.4*  HCT 34.6* 35.2*  PLT 193 216   BMET  Recent Labs  10/29/16 1709 10/30/16 0428  NA  --  138  K  --  5.2*  CL  --  106  CO2  --  27  CREATININE 1.09* 1.10*   LFT  Recent Labs  10/30/16 0428  PROT 6.8  AST 95*  ALT 61*  ALKPHOS 576*  BILITOT 1.0   PT/INR No results for input(s): LABPROT, INR in the last 72 hours. PANCREAS  Recent Labs  10/30/16 0428  LIPASE 25         Studies/Results: Dg Ercp  Result Date: 10/29/2016 CLINICAL DATA:  61 year old female with choledocholithiasis EXAM: ERCP TECHNIQUE: Multiple spot images obtained with the fluoroscopic device and submitted for interpretation post-procedure. FLUOROSCOPY TIME:  Fluoroscopy Time:  6 minutes 30 seconds COMPARISON:  MRCP 10/14/2016 FINDINGS: Multiple intraoperative fluoroscopic spot images during ERCP. Initial image demonstrates endoscope projecting over the upper abdomen with cannulation of  the ampulla and retrograde infusion of contrast. There are several vague filling defects present. Subsequently a balloon trawl is placed. Intermediate images demonstrate resolution of the vague filling defects. IMPRESSION: Limited intraoperative fluoroscopic spot images during ERCP demonstrate treatment of cholelithiasis with balloon trawl. Please refer to the dictated operative report for full details of intraoperative findings and procedure. Signed, Yvone NeuJaime S. Loreta AveWagner, DO [facility] Electronically Signed   By: Gilmer MorJaime  Wagner D.O.   On: 10/29/2016 16:32    Medications: I have reviewed the patient's current medications.  Assessment/Plan: 1. Choledocholithiasis. Status post ERCP and stone extraction via laparoscopic gastrostomy. Lipase is normal liver test appear to be improved patient is asymptomatic. Would go ahead and resume her diet. We'll be happy to see back again as needed.   Stacy Lane,Stacy Lane 10/30/2016, 8:07 AM  This note was created using voice recognition software. Minor errors may Have occurred unintentionally.  Pager: [phone] If no answer or after hours call [phone]

## 2016-10-30 NOTE — Discharge Instructions (Signed)
Endoscopic Retrograde Cholangiopancreatography (ERCP), Care After °Refer to this sheet in the next few weeks. These instructions provide you with information on caring for yourself after your procedure. Your health care provider may also give you more specific instructions. Your treatment has been planned according to current medical practices, but problems sometimes occur. Call your health care provider if you have any problems or questions after your procedure.  °WHAT TO EXPECT AFTER THE PROCEDURE  °After your procedure, it is typical to feel:  °· Soreness in your throat.   °· Sick to your stomach (nauseous).   °· Bloated. °· Dizzy.   °· Fatigued. °HOME CARE INSTRUCTIONS °· Have a friend or family member stay with you for the first 24 hours after your procedure. °· Start taking your usual medicines and eating normally as soon as you feel well enough to do so or as directed by your health care provider. °SEEK MEDICAL CARE IF: °· You have abdominal pain.   °· You develop signs of infection, such as:   °¨ Chills.   °¨ Feeling unwell.   °SEEK IMMEDIATE MEDICAL CARE IF: °· You have difficulty swallowing. °· You have worsening throat, chest, or abdominal pain. °· You vomit. °· You have bloody or very black stools. °· You have a fever. °This information is not intended to replace advice given to you by your health care provider. Make sure you discuss any questions you have with your health care provider. °Document Released: 06/22/2013 Document Reviewed: 06/22/2013 °Elsevier Interactive Patient Education © 2017 [employer] ° °

## 2016-10-31 ENCOUNTER — Other Ambulatory Visit: Payer: Self-pay | Admitting: *Deleted

## 2016-10-31 NOTE — Patient Outreach (Signed)
[facility] Care Management  10/31/2016  Stacy HendersonLori Lane [birth year] [id number]   [facility], states she is following patient for CM, she will refer to internal CM program if needed, and no need for [facility] RNCM to outreach to patient at this time.   RNCM will proceed with case closure and send case closure request to Stacy AlaminLaura Lane at [facility] to close case due to other CM program.    Stacy Lane H. Stacy Barefootooper RN, BSN, CCM [facility] Telephonic CM Phone: [phone] Fax: [phone]

## 2017-01-26 ENCOUNTER — Other Ambulatory Visit: Payer: Self-pay | Admitting: Gastroenterology

## 2017-01-26 DIAGNOSIS — R7989 Other specified abnormal findings of blood chemistry: Secondary | ICD-10-CM

## 2017-01-26 DIAGNOSIS — R932 Abnormal findings on diagnostic imaging of liver and biliary tract: Secondary | ICD-10-CM

## 2017-01-26 DIAGNOSIS — R945 Abnormal results of liver function studies: Principal | ICD-10-CM

## 2017-02-04 ENCOUNTER — Other Ambulatory Visit: Payer: Managed Care, Other (non HMO)

## 2017-02-06 ENCOUNTER — Other Ambulatory Visit: Payer: Managed Care, Other (non HMO)

## 2017-03-13 ENCOUNTER — Other Ambulatory Visit: Payer: Managed Care, Other (non HMO)

## 2017-03-17 ENCOUNTER — Ambulatory Visit
Admission: RE | Admit: 2017-03-17 | Discharge: 2017-03-17 | Disposition: A | Discharge status: Home / Self Care | Payer: Managed Care, Other (non HMO) | Source: Ambulatory Visit | Attending: Gastroenterology | Admitting: Gastroenterology

## 2017-03-17 DIAGNOSIS — R945 Abnormal results of liver function studies: Principal | ICD-10-CM

## 2017-03-17 DIAGNOSIS — R932 Abnormal findings on diagnostic imaging of liver and biliary tract: Secondary | ICD-10-CM

## 2017-03-17 DIAGNOSIS — R7989 Other specified abnormal findings of blood chemistry: Secondary | ICD-10-CM

## 2017-07-20 IMAGING — MR MR ABDOMEN WO/W CM MRCP
12 of 18 series · 30 of 48 positions shown · IV contrast (multihance)
Comparison: Intraoperative cholangiogram 07/15/2016

CLINICAL DATA: Choledocholithiasis. Abdominal pain.
Postcholecystectomy.

EXAM:
MRI ABDOMEN WITHOUT AND WITH CONTRAST (INCLUDING MRCP)
TECHNIQUE: Multiplanar multisequence MR imaging of the abdomen was performed
both before and after the administration of intravenous contrast.
Heavily T2-weighted images of the biliary and pancreatic ducts were
obtained, and three-dimensional MRCP images were rendered by post
processing.
CONTRAST:  16 mL MultiHance

[Series 3: T2 · axial · 5.0mm · 0.94mm/px · z∈[-38,+183]mm · 2 of 35 slices shown (1 of 3)]
[im 1/35]
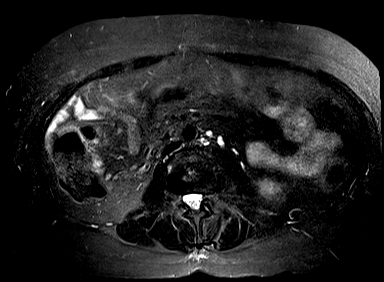
[im 35/35]
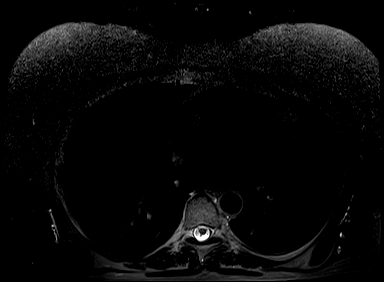

[Series 4: ep2d_diff_b50_500_800_p2_trig · axial · 5.0mm · 1.88mm/px · z∈[-47,+180]mm · 5 of 108 slices shown]
[im 1/108]
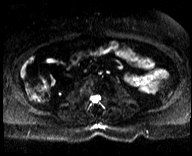
[im 27/108]
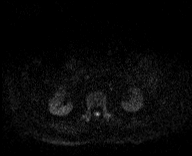
[im 54/108]
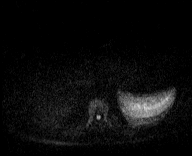
[im 81/108]
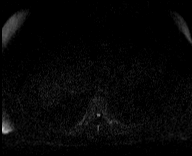
[im 108/108]
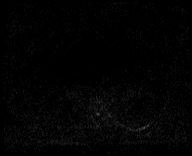

[Series 5: ep2d_diff_b50_500_800_p2_trig_adc · axial · 5.0mm · 1.88mm/px · z∈[-47,+180]mm · 2 of 36 slices shown]
[im 1/36]
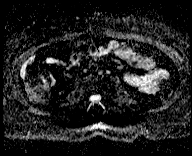
[im 36/36]
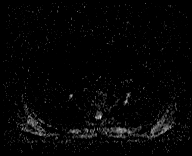

[Series 6: bSSFP · coronal · 4.0mm · 0.70mm/px · 2 of 45 slices shown]
[im 1/45]
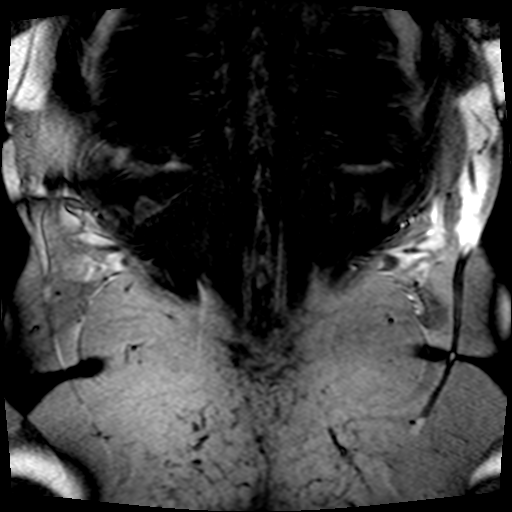
[im 45/45]
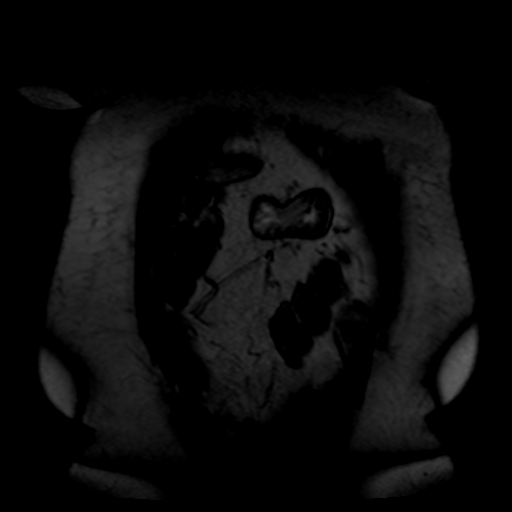

[Series 9: T2 · coronal · 3.0mm · 0.70mm/px · 1 of 32 slices shown (2 of 3)]
[im 1/32]
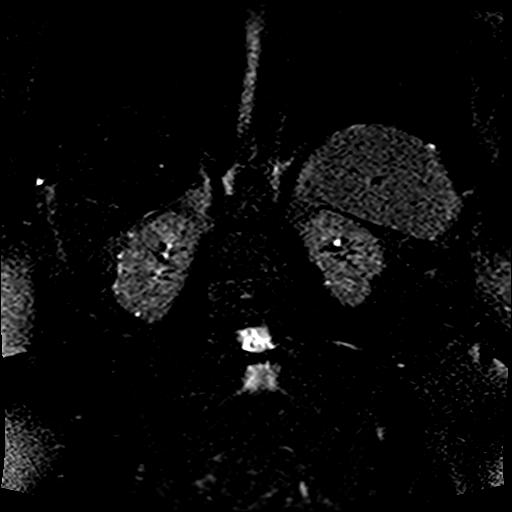

[Series 12: T1 · axial · 5.0mm · 0.70mm/px · z∈[-78,+130]mm · 3 of 66 slices shown]
[im 1/66]
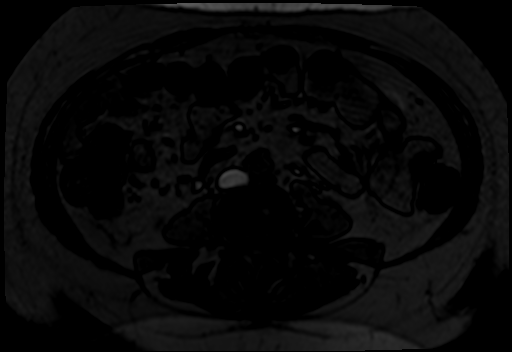
[im 33/66]
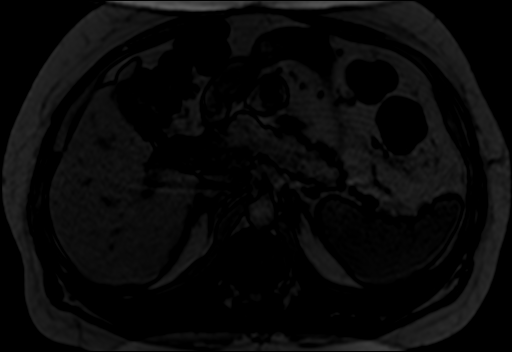
[im 66/66]
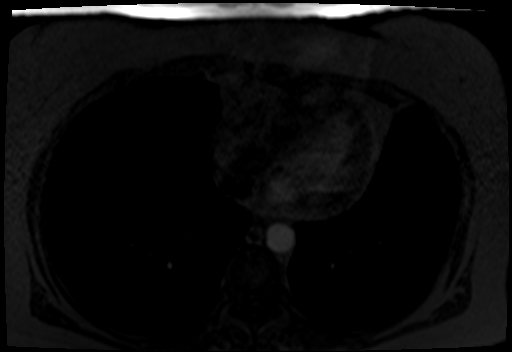

[Series 13: T2 · axial · 5.5mm · 0.70mm/px · 1 of 32 slices shown (3 of 3)]
[im 1/32]
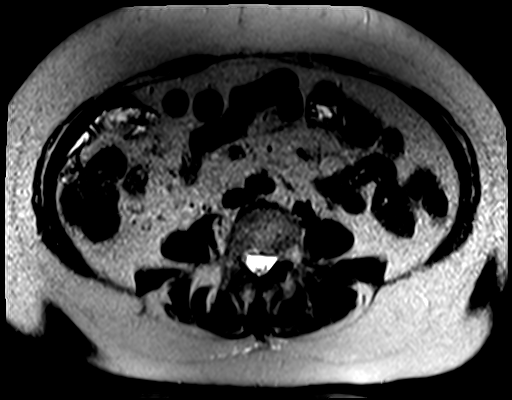

[Series 14: T1 dynamic fat-sat · axial · non-contrast · 3.0mm · 0.70mm/px · z∈[-74,+139]mm · 3 of 72 slices shown]
[im 1/72]
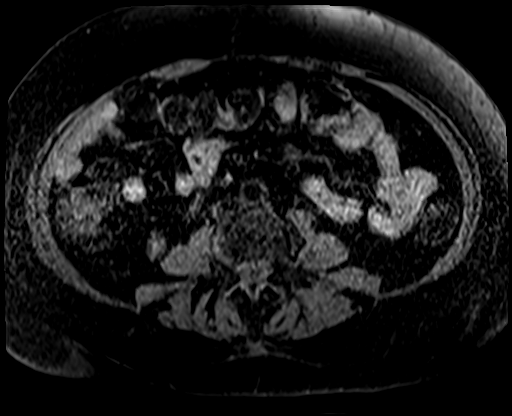
[im 36/72]
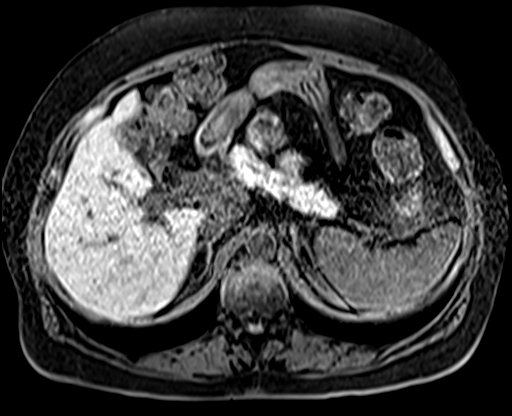
[im 72/72]
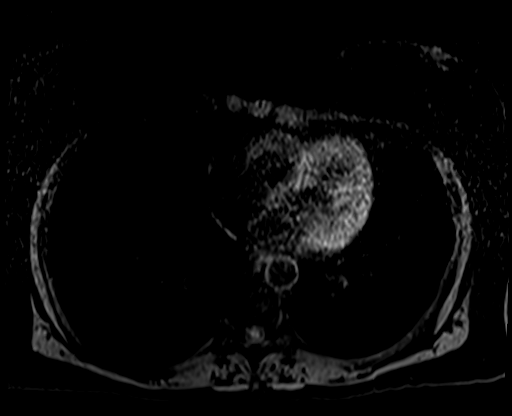

[Series 15: T1 dynamic post-contrast · axial · 3.0mm · 0.70mm/px · z∈[-74,+139]mm · 3 of 72 slices shown (1 of 4)]
[im 1/72]
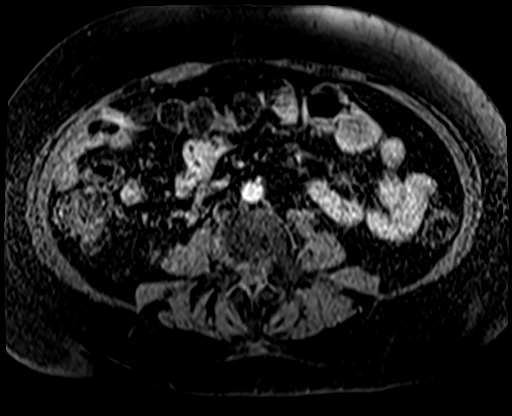
[im 36/72]
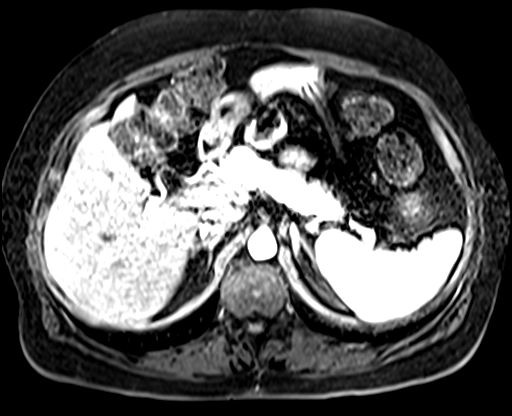
[im 72/72]
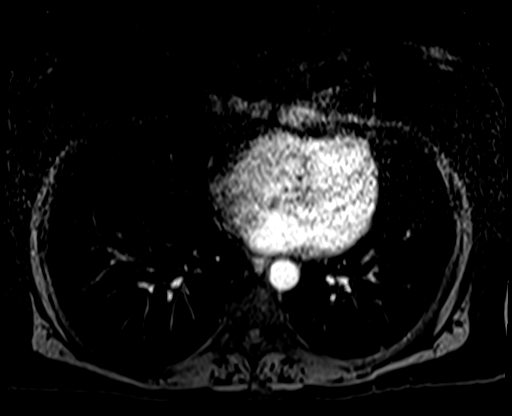

[Series 16: T1 dynamic post-contrast · axial · 3.0mm · 0.70mm/px · z∈[-74,+139]mm · 3 of 72 slices shown (2 of 4)]
[im 1/72]
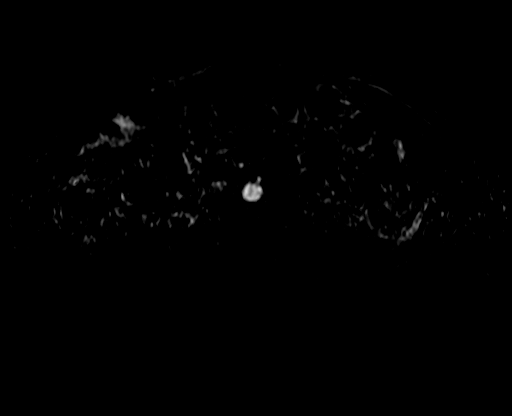
[im 36/72]
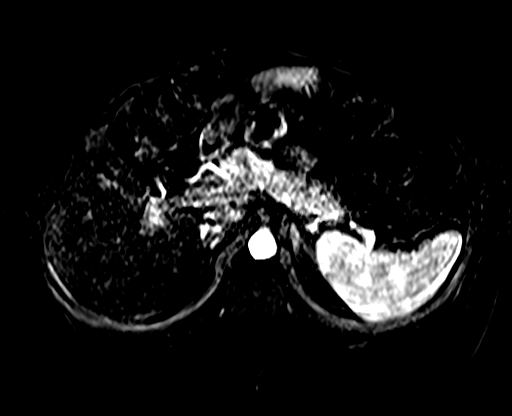
[im 72/72]
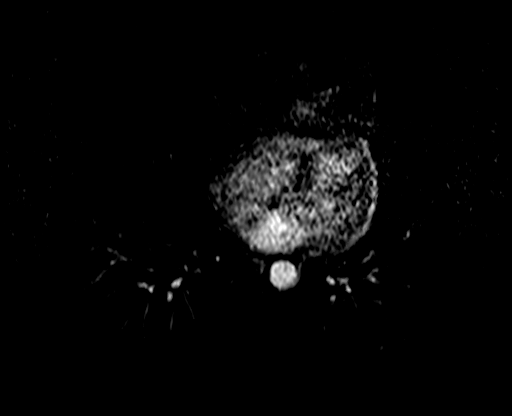

[Series 17: T1 dynamic post-contrast · axial · 3.0mm · 0.70mm/px · z∈[-74,+139]mm · 3 of 72 slices shown (3 of 4)]
[im 1/72]
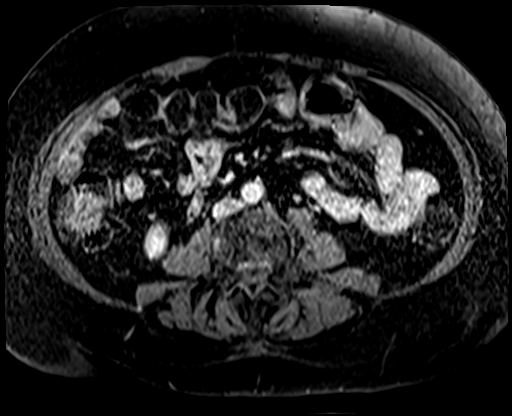
[im 36/72]
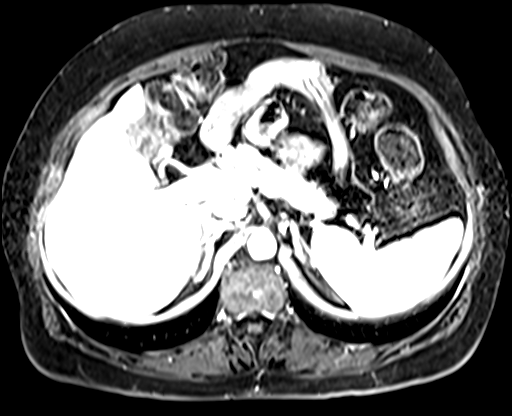
[im 72/72]
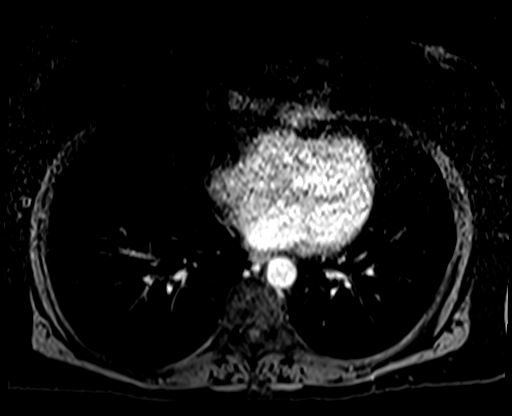

[Series 18: T1 dynamic post-contrast · axial · 3.0mm · 0.70mm/px · z∈[-74,+31]mm · 2 of 72 slices shown (4 of 4)]
[im 1/72]
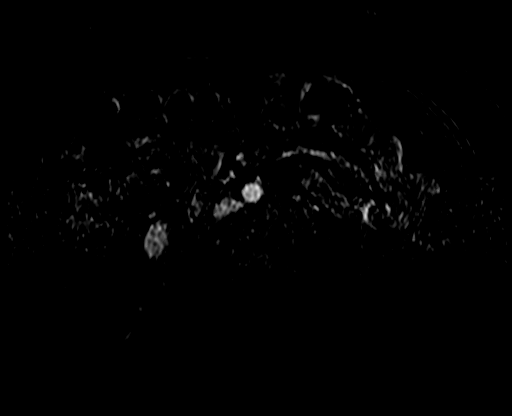
[im 36/72]
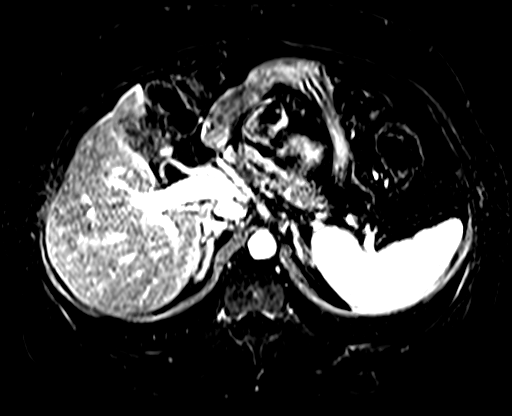

[30 of 48 positions shown; findings below may reference images not displayed]

FINDINGS: Lower chest:  Lung bases are clear.

Hepatobiliary: No intrahepatic duct dilatation. Postcholecystectomy.
There is dilatation of the common hepatic duct to the 13 mm. Common
bile duct minimally dilated to 9 mm. There several small stones in
the distal common bile duct measuring approximately 5 mm each and
numbering 4 to 5 (image 67, series 7).

Pancreas: NO evidence pancreatic duct dilatation or inflammation.
There is variant ductal anatomy (ductus divisum).

Spleen: Normal spleen.

Adrenals/urinary tract: Adrenal glands and kidneys are normal.

Stomach/Bowel: Stomach and limited of the small bowel is
unremarkable

Vascular/Lymphatic: Abdominal aortic normal caliber. No
retroperitoneal periportal lymphadenopathy.

Musculoskeletal: No aggressive osseous lesion
IMPRESSION: 1. Choledocholithiasis with mild to moderate extrahepatic duct
dilatation. No intrahepatic duct dilatation.
2. No evidence acute pancreatitis.
3. Variant pancreatic ductal anatomy (ductus divisum).

## 2019-01-04 IMAGING — US US ABDOMEN COMPLETE
1 series · 14 of 25 positions shown · non-contrast
Comparison: ERCP images 10/29/2016.  MRI of the abdomen 10/14/2016.

CLINICAL DATA: Elevated LFTs.

EXAM:
ABDOMEN ULTRASOUND COMPLETE

[Series 1: us abdomen complete · 0.23mm/px · 14 of 70 slices shown]
[im 1/70]
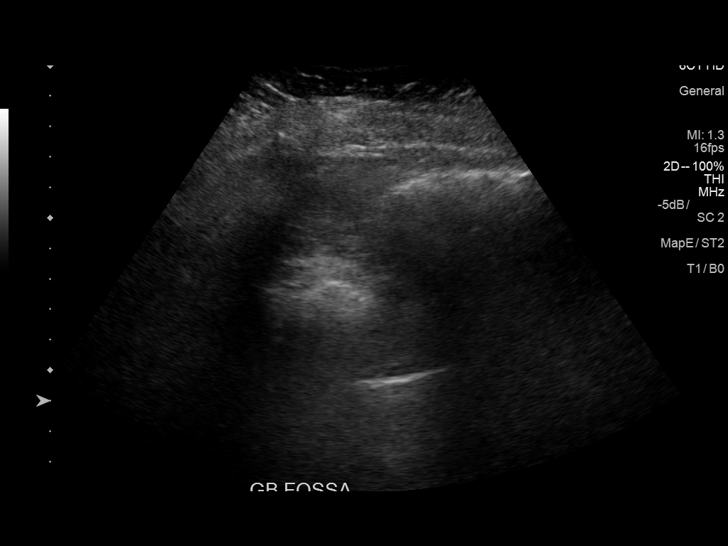
[im 6/70]
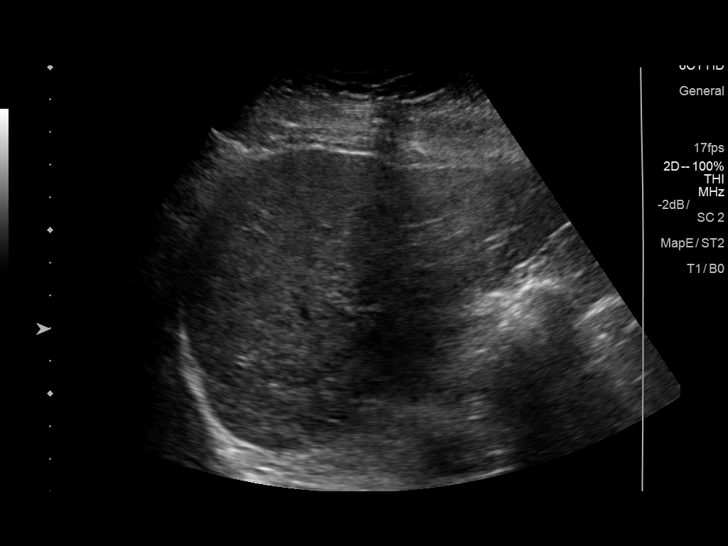
[im 12/70]
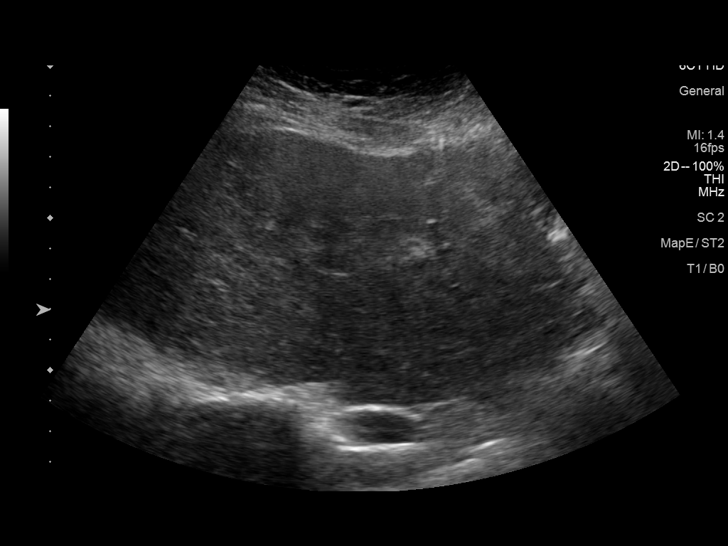
[im 18/70]
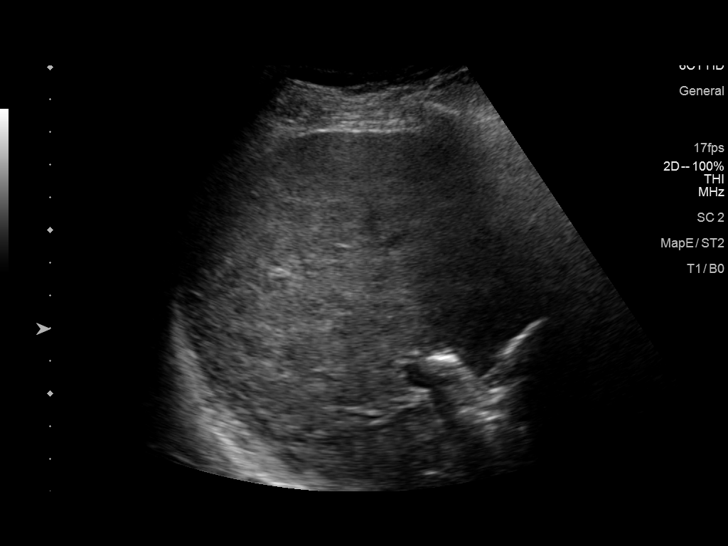
[im 24/70]
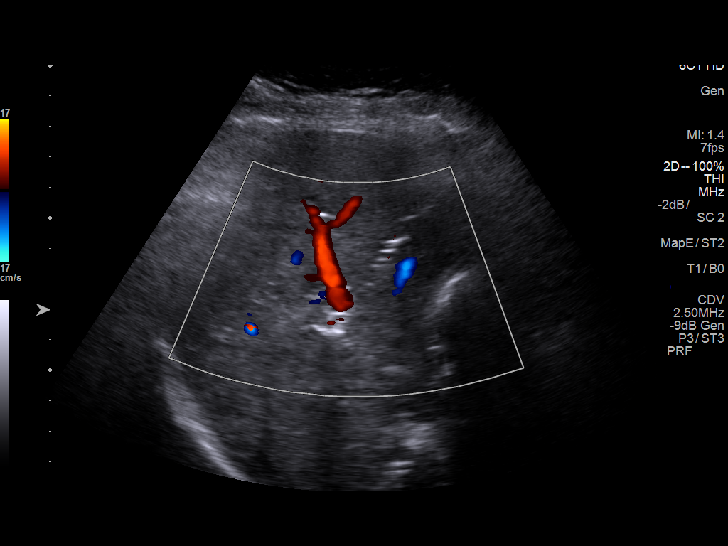
[im 26/70]
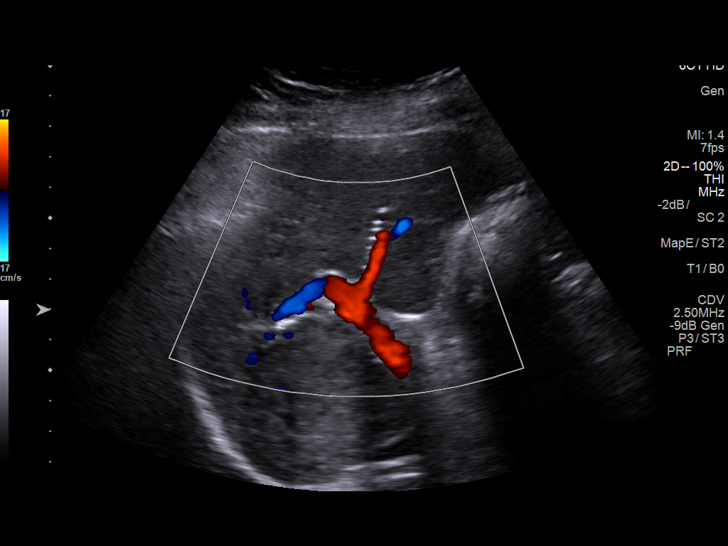
[im 32/70]
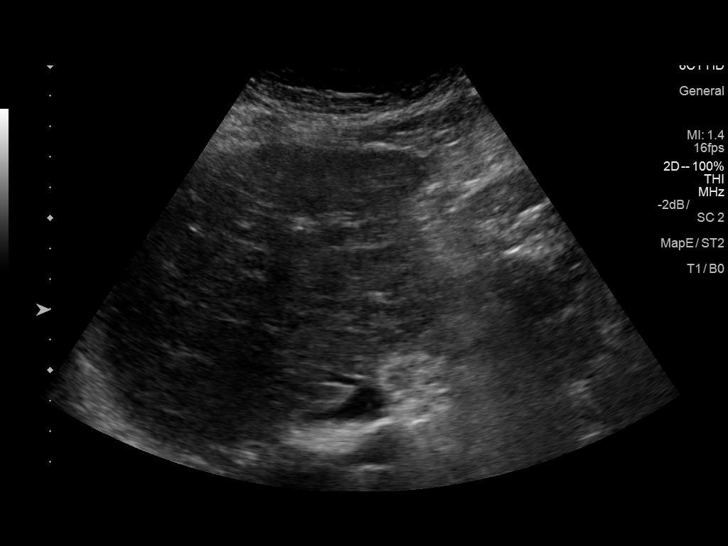
[im 38/70]
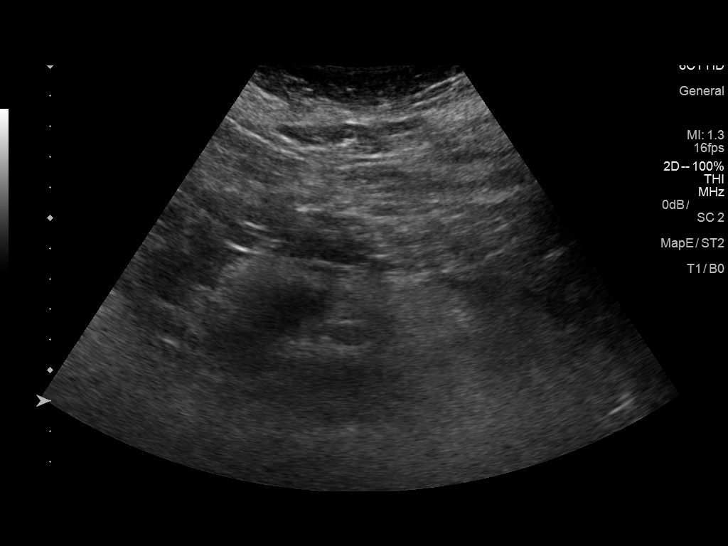
[im 44/70]
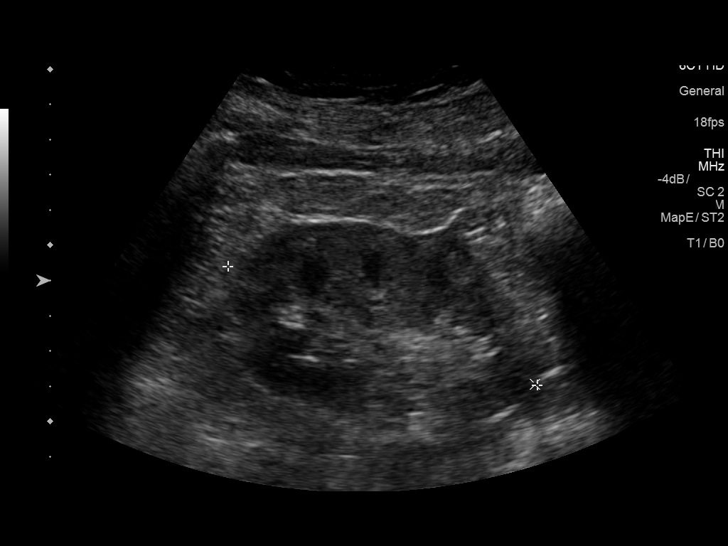
[im 47/70]
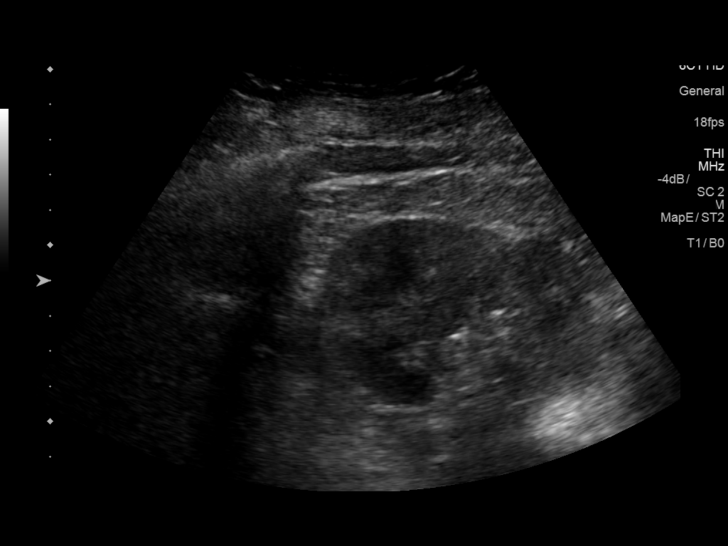
[im 52/70]
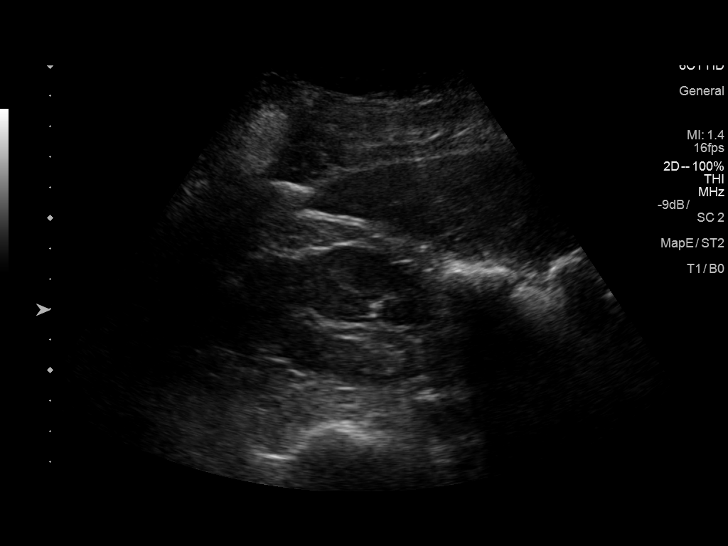
[im 58/70]
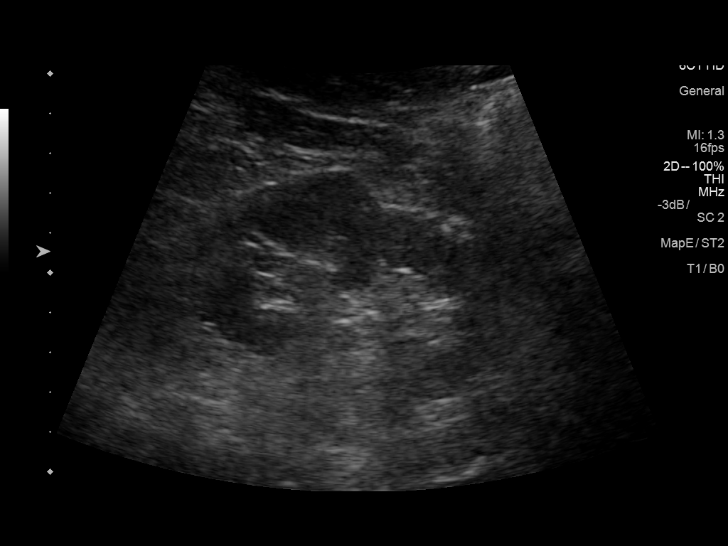
[im 64/70]
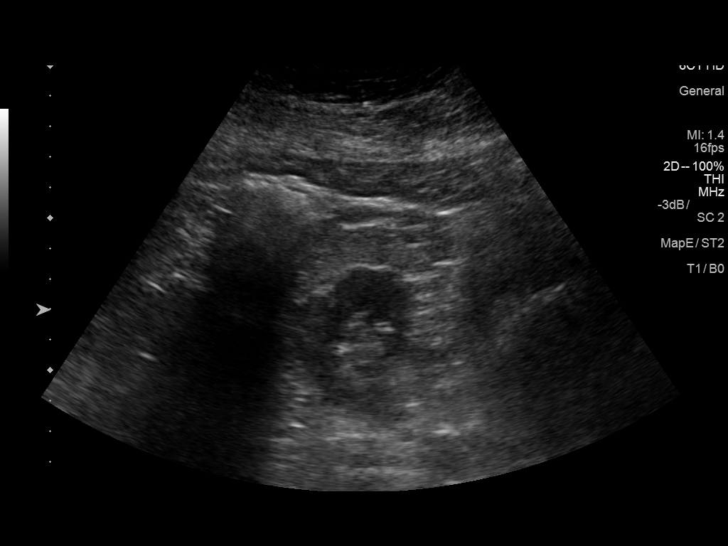
[im 70/70]
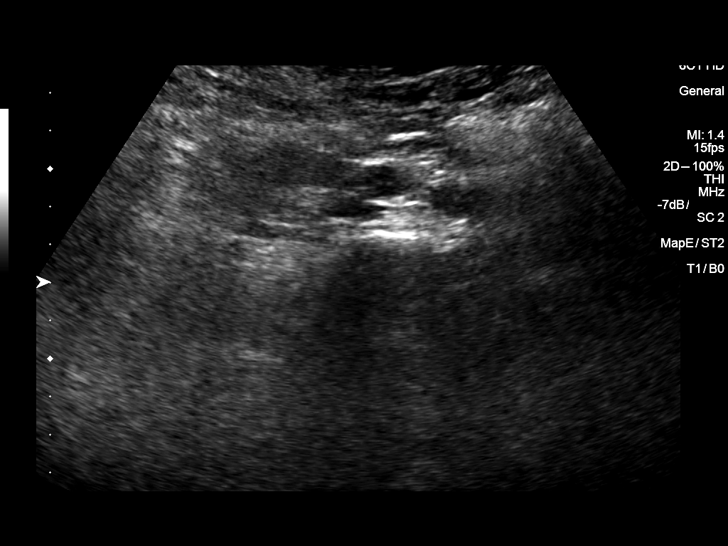

[14 of 25 positions shown; findings below may reference images not displayed]

FINDINGS: Gallbladder: Cholecystectomy.

Common bile duct: Diameter: 4.9 mm

Liver: Liver is inhomogeneous. Echodensities noted throughout the
liver most likely represent air in the biliary ducts from prior
biliary procedure. No focal hepatic abnormality identified.

IVC: No abnormality visualized.

Pancreas: Poorly visualized due to overlying bowel gas.

Spleen: Size and appearance within normal limits.

Right Kidney: Length: 9.5 cm. Echogenicity within normal limits. No
mass or hydronephrosis visualized.

Left Kidney: Length: 9.5 cm. Echogenicity within normal limits. No
mass or hydronephrosis visualized.

Abdominal aorta: No aneurysm visualized.

Other findings: None.
IMPRESSION: 1. Cholecystectomy. No biliary distention. Common bile duct measures
4.9 mm. Small echogenic foci in the liver most likely related to air
in the biliary ducts from prior biliary procedure.

2. Heterogeneous hepatic echotexture, fatty infiltration and/or
hepatocellular disease cannot be excluded . No focal hepatic
abnormality identified.

## 2023-11-26 DIAGNOSIS — H2513 Age-related nuclear cataract, bilateral: Secondary | ICD-10-CM | POA: Diagnosis not present

## 2024-02-29 DIAGNOSIS — N1831 Chronic kidney disease, stage 3a: Secondary | ICD-10-CM | POA: Diagnosis not present

## 2024-02-29 DIAGNOSIS — I129 Hypertensive chronic kidney disease with stage 1 through stage 4 chronic kidney disease, or unspecified chronic kidney disease: Secondary | ICD-10-CM | POA: Diagnosis not present
